# Patient Record
Sex: Male | Born: 1990 | Race: Black or African American | Hispanic: No | Marital: Single | Smoking: Former smoker
Health system: Southern US, Community
[De-identification: ages and names within clinical notes are randomized; demographics above are authoritative.]

## PROBLEM LIST (undated history)

## (undated) ENCOUNTER — Ambulatory Visit

## (undated) ENCOUNTER — Ambulatory Visit: Admission: EM

## (undated) ENCOUNTER — Ambulatory Visit: Admission: EM | Source: Home / Self Care

## (undated) DIAGNOSIS — R569 Unspecified convulsions: Secondary | ICD-10-CM

## (undated) DIAGNOSIS — G40909 Epilepsy, unspecified, not intractable, without status epilepticus: Secondary | ICD-10-CM

---

## 2009-08-04 ENCOUNTER — Emergency Department (HOSPITAL_COMMUNITY): Admission: EM | Admit: 2009-08-04 | Discharge: 2009-08-04 | Payer: Self-pay | Admitting: Emergency Medicine

## 2009-08-20 ENCOUNTER — Emergency Department (HOSPITAL_COMMUNITY): Admission: EM | Admit: 2009-08-20 | Discharge: 2009-08-20 | Payer: Self-pay | Admitting: Emergency Medicine

## 2009-09-21 ENCOUNTER — Emergency Department (HOSPITAL_COMMUNITY): Admission: EM | Admit: 2009-09-21 | Discharge: 2009-09-22 | Payer: Self-pay | Admitting: Emergency Medicine

## 2009-09-28 ENCOUNTER — Emergency Department (HOSPITAL_COMMUNITY): Admission: EM | Admit: 2009-09-28 | Discharge: 2009-09-29 | Payer: Self-pay | Admitting: Emergency Medicine

## 2009-12-13 ENCOUNTER — Emergency Department (HOSPITAL_COMMUNITY)
Admission: EM | Admit: 2009-12-13 | Discharge: 2009-12-13 | Payer: Self-pay | Source: Home / Self Care | Admitting: Emergency Medicine

## 2010-01-09 ENCOUNTER — Emergency Department (HOSPITAL_COMMUNITY)
Admission: EM | Admit: 2010-01-09 | Discharge: 2010-01-09 | Payer: Self-pay | Source: Home / Self Care | Admitting: Emergency Medicine

## 2010-03-29 LAB — DIFFERENTIAL
Basophils Absolute: 0 10*3/uL (ref 0.0–0.1)
Lymphocytes Relative: 17 % (ref 12–46)
Lymphs Abs: 1.7 10*3/uL (ref 0.7–4.0)
Neutro Abs: 7.9 10*3/uL — ABNORMAL HIGH (ref 1.7–7.7)

## 2010-03-29 LAB — COMPREHENSIVE METABOLIC PANEL
ALT: 16 U/L (ref 0–53)
Alkaline Phosphatase: 61 U/L (ref 39–117)
CO2: 29 mEq/L (ref 19–32)
Calcium: 9.3 mg/dL (ref 8.4–10.5)
Chloride: 105 mEq/L (ref 96–112)
GFR calc Af Amer: >60 mL/min (ref >60)
GFR calc non Af Amer: >60 mL/min (ref >60)
Glucose, Bld: 70 mg/dL (ref 70–99)
Potassium: 4.4 mEq/L (ref 3.5–5.1)
Total Bilirubin: 0.4 mg/dL (ref 0.3–1.2)
Total Protein: 6.8 g/dL (ref 6.0–8.3)

## 2010-03-29 LAB — CBC
MCV: 87.1 fL (ref 78.0–100.0)
Platelets: 176 10*3/uL (ref 150–400)
RDW: 12.4 % (ref 11.5–15.5)

## 2010-03-29 LAB — ETHANOL: Alcohol, Ethyl (B): <5 mg/dL (ref 0–10)

## 2010-03-29 LAB — RAPID URINE DRUG SCREEN, HOSP PERFORMED
Amphetamines: NOT DETECTED
Barbiturates: NOT DETECTED
Opiates: NOT DETECTED
Tetrahydrocannabinol: POSITIVE — AB

## 2010-03-31 ENCOUNTER — Emergency Department (HOSPITAL_COMMUNITY): Payer: Self-pay

## 2010-03-31 ENCOUNTER — Emergency Department (HOSPITAL_COMMUNITY)
Admission: EM | Admit: 2010-03-31 | Discharge: 2010-03-31 | Disposition: A | Discharge status: Home / Self Care | Payer: Self-pay | Attending: Emergency Medicine | Admitting: Emergency Medicine

## 2010-03-31 DIAGNOSIS — R569 Unspecified convulsions: Secondary | ICD-10-CM | POA: Insufficient documentation

## 2010-03-31 DIAGNOSIS — N289 Disorder of kidney and ureter, unspecified: Secondary | ICD-10-CM | POA: Insufficient documentation

## 2010-03-31 DIAGNOSIS — W1809XA Striking against other object with subsequent fall, initial encounter: Secondary | ICD-10-CM | POA: Insufficient documentation

## 2010-03-31 DIAGNOSIS — S0003XA Contusion of scalp, initial encounter: Secondary | ICD-10-CM | POA: Insufficient documentation

## 2010-03-31 LAB — POCT I-STAT, CHEM 8
BUN: 10 mg/dL (ref 6–23)
Creatinine, Ser: 1.8 mg/dL — ABNORMAL HIGH (ref 0.4–1.5)
Glucose, Bld: 103 mg/dL — ABNORMAL HIGH (ref 70–99)
Sodium: 139 mEq/L (ref 135–145)

## 2010-03-31 LAB — DIFFERENTIAL
Basophils Relative: 1 % (ref 0–1)
Eosinophils Absolute: 0.1 10*3/uL (ref 0.0–0.7)
Eosinophils Relative: 1 % (ref 0–5)
Lymphs Abs: 2.7 10*3/uL (ref 0.7–4.0)
Monocytes Absolute: 0.4 10*3/uL (ref 0.1–1.0)
Neutrophils Relative %: 59 % (ref 43–77)

## 2010-03-31 LAB — CBC
HCT: 43.4 % (ref 39.0–52.0)
Hemoglobin: 15.3 g/dL (ref 13.0–17.0)
MCH: 30.5 pg (ref 26.0–34.0)
RBC: 5.01 MIL/uL (ref 4.22–5.81)
WBC: 7.8 10*3/uL (ref 4.0–10.5)

## 2010-04-01 LAB — URINALYSIS, ROUTINE W REFLEX MICROSCOPIC
Bilirubin Urine: NEGATIVE
Glucose, UA: NEGATIVE mg/dL
Ketones, ur: NEGATIVE mg/dL
Nitrite: NEGATIVE
Protein, ur: NEGATIVE mg/dL
Specific Gravity, Urine: 1.024 (ref 1.005–1.030)
Urobilinogen, UA: 0.2 mg/dL (ref 0.0–1.0)
pH: 5.5 (ref 5.0–8.0)

## 2010-04-01 LAB — URINE MICROSCOPIC-ADD ON

## 2010-04-01 LAB — GC/CHLAMYDIA PROBE AMP, GENITAL: GC Probe Amp, Genital: POSITIVE — AB

## 2010-04-03 LAB — GC/CHLAMYDIA PROBE AMP, GENITAL
Chlamydia, DNA Probe: POSITIVE — AB
GC Probe Amp, Genital: POSITIVE — AB

## 2010-06-17 ENCOUNTER — Emergency Department (HOSPITAL_COMMUNITY)
Admission: EM | Admit: 2010-06-17 | Discharge: 2010-06-17 | Disposition: A | Discharge status: Home / Self Care | Payer: Self-pay | Attending: Emergency Medicine | Admitting: Emergency Medicine

## 2010-06-17 DIAGNOSIS — Z79899 Other long term (current) drug therapy: Secondary | ICD-10-CM | POA: Insufficient documentation

## 2010-06-17 DIAGNOSIS — G40909 Epilepsy, unspecified, not intractable, without status epilepticus: Secondary | ICD-10-CM | POA: Insufficient documentation

## 2010-06-17 LAB — BASIC METABOLIC PANEL
BUN: 11 mg/dL (ref 6–23)
CO2: 22 mEq/L (ref 19–32)
Calcium: 8.6 mg/dL (ref 8.4–10.5)
Chloride: 104 mEq/L (ref 96–112)
GFR calc Af Amer: >60 mL/min (ref >60)
GFR calc non Af Amer: 58 mL/min — ABNORMAL LOW (ref >60)
Glucose, Bld: 124 mg/dL — ABNORMAL HIGH (ref 70–99)

## 2010-06-17 LAB — CBC
HCT: 40 % (ref 39.0–52.0)
Hemoglobin: 13.6 g/dL (ref 13.0–17.0)
MCH: 29.5 pg (ref 26.0–34.0)
MCHC: 34 g/dL (ref 30.0–36.0)
MCV: 86.8 fL (ref 78.0–100.0)
RBC: 4.61 MIL/uL (ref 4.22–5.81)

## 2010-06-17 LAB — DIFFERENTIAL
Basophils Absolute: 0 10*3/uL (ref 0.0–0.1)
Eosinophils Relative: 1 % (ref 0–5)
Monocytes Relative: 6 % (ref 3–12)
Neutro Abs: 3 10*3/uL (ref 1.7–7.7)
Neutrophils Relative %: 65 % (ref 43–77)

## 2010-09-26 ENCOUNTER — Emergency Department (HOSPITAL_COMMUNITY)
Admission: EM | Admit: 2010-09-26 | Discharge: 2010-09-26 | Disposition: A | Discharge status: Home / Self Care | Payer: Self-pay | Attending: Emergency Medicine | Admitting: Emergency Medicine

## 2010-09-26 DIAGNOSIS — M549 Dorsalgia, unspecified: Secondary | ICD-10-CM | POA: Insufficient documentation

## 2010-09-26 DIAGNOSIS — G40909 Epilepsy, unspecified, not intractable, without status epilepticus: Secondary | ICD-10-CM | POA: Insufficient documentation

## 2010-09-26 LAB — COMPREHENSIVE METABOLIC PANEL
ALT: 19 U/L (ref 0–53)
AST: 29 U/L (ref 0–37)
Albumin: 3.7 g/dL (ref 3.5–5.2)
Alkaline Phosphatase: 77 U/L (ref 39–117)
BUN: 11 mg/dL (ref 6–23)
CO2: 27 mEq/L (ref 19–32)
Calcium: 10.1 mg/dL (ref 8.4–10.5)
Chloride: 101 mEq/L (ref 96–112)
Creatinine, Ser: 1.33 mg/dL (ref 0.50–1.35)
GFR calc Af Amer: >60 mL/min (ref >60)
GFR calc non Af Amer: >60 mL/min (ref >60)
Glucose, Bld: 84 mg/dL (ref 70–99)
Potassium: 3.8 mEq/L (ref 3.5–5.1)
Sodium: 137 mEq/L (ref 135–145)
Total Bilirubin: 0.5 mg/dL (ref 0.3–1.2)
Total Protein: 6.9 g/dL (ref 6.0–8.3)

## 2010-09-26 LAB — URINALYSIS, ROUTINE W REFLEX MICROSCOPIC
Bilirubin Urine: NEGATIVE
Glucose, UA: NEGATIVE mg/dL
Ketones, ur: NEGATIVE mg/dL
Nitrite: NEGATIVE
Protein, ur: NEGATIVE mg/dL
Specific Gravity, Urine: 1.017 (ref 1.005–1.030)
Urobilinogen, UA: 0.2 mg/dL (ref 0.0–1.0)
pH: 6.5 (ref 5.0–8.0)

## 2010-09-26 LAB — URINE MICROSCOPIC-ADD ON

## 2010-12-10 ENCOUNTER — Emergency Department (HOSPITAL_COMMUNITY)
Admission: EM | Admit: 2010-12-10 | Discharge: 2010-12-10 | Disposition: A | Discharge status: Home / Self Care | Payer: Self-pay | Attending: Emergency Medicine | Admitting: Emergency Medicine

## 2010-12-10 ENCOUNTER — Encounter: Payer: Self-pay | Admitting: *Deleted

## 2010-12-10 DIAGNOSIS — Z760 Encounter for issue of repeat prescription: Secondary | ICD-10-CM | POA: Insufficient documentation

## 2010-12-10 DIAGNOSIS — Z79899 Other long term (current) drug therapy: Secondary | ICD-10-CM | POA: Insufficient documentation

## 2010-12-10 DIAGNOSIS — F17200 Nicotine dependence, unspecified, uncomplicated: Secondary | ICD-10-CM | POA: Insufficient documentation

## 2010-12-10 HISTORY — DX: Unspecified convulsions: R56.9

## 2010-12-10 MED ORDER — LEVETIRACETAM 500 MG PO TABS: 500.0000 mg | ORAL_TABLET | Freq: Two times a day (BID) | ORAL | Status: DC

## 2010-12-10 NOTE — ED Notes (Signed)
The pt wants his seizure meds refilled.  He took his last dose yesterday and his doctor is not in the  Office until monday

## 2010-12-10 NOTE — ED Provider Notes (Signed)
Medical screening examination/treatment/procedure(s) were performed by non-physician practitioner and as supervising physician I was immediately available for consultation/collaboration.  Juliet Rude. Rubin Payor, MD 12/10/10 2350

## 2010-12-10 NOTE — ED Notes (Signed)
The pt has no change in his condition needs  rx refilled

## 2010-12-10 NOTE — ED Provider Notes (Signed)
History     CSN: [id number] Arrival date & time: 12/10/2010  7:45 PM   First MD Initiated Contact with Patient 12/10/10 2102      Chief Complaint  Patient presents with  . Medication Refill    (Consider location/radiation/quality/duration/timing/severity/associated sxs/prior treatment) The history is provided by the patient. No language interpreter was used.  Patient states he ran out of his keppra after his dose yesterday.  Patient without any other complaint.  Requesting medication refill.  Past Medical History  Diagnosis Date  . Seizures     History reviewed. No pertinent past surgical history.  History reviewed. No pertinent family history.  History  Substance Use Topics  . Smoking status: Current Everyday Smoker  . Smokeless tobacco: Not on file  . Alcohol Use: No      Review of Systems  All other systems reviewed and are negative.    Allergies  Review of patient's allergies indicates no known allergies.  Home Medications   Current Outpatient Rx  Name Route Sig Dispense Refill  . LEVETIRACETAM 500 MG PO TABS Oral Take 500 mg by mouth 2 (two) times daily.        BP 100/63  Pulse 78  Temp(Src) 98.6 F (37 C) (Oral)  Resp 18  SpO2 98%  Physical Exam  Nursing note and vitals reviewed. Constitutional: He appears well-developed and well-nourished.  Cardiovascular: Normal rate and normal heart sounds.   Pulmonary/Chest: Effort normal and breath sounds normal.  Abdominal: Soft. Bowel sounds are normal.  Psychiatric: He has a normal mood and affect.    ED Course  Procedures (including critical care time)  Labs Reviewed - No data to display No results found.   No diagnosis found.    MDM  Medication refill        Jimmye Norman, NP 12/10/10 2123

## 2011-01-04 ENCOUNTER — Emergency Department (HOSPITAL_COMMUNITY)
Admission: EM | Admit: 2011-01-04 | Discharge: 2011-01-04 | Disposition: A | Discharge status: Home / Self Care | Payer: Self-pay | Attending: Emergency Medicine | Admitting: Emergency Medicine

## 2011-01-04 ENCOUNTER — Other Ambulatory Visit: Payer: Self-pay

## 2011-01-04 DIAGNOSIS — R569 Unspecified convulsions: Secondary | ICD-10-CM

## 2011-01-04 DIAGNOSIS — G40909 Epilepsy, unspecified, not intractable, without status epilepticus: Secondary | ICD-10-CM | POA: Insufficient documentation

## 2011-01-04 DIAGNOSIS — Z79899 Other long term (current) drug therapy: Secondary | ICD-10-CM | POA: Insufficient documentation

## 2011-01-04 LAB — URINALYSIS, ROUTINE W REFLEX MICROSCOPIC
Bilirubin Urine: NEGATIVE
Ketones, ur: NEGATIVE mg/dL
Nitrite: NEGATIVE
Urobilinogen, UA: 0.2 mg/dL (ref 0.0–1.0)
pH: 5 (ref 5.0–8.0)

## 2011-01-04 LAB — POCT I-STAT, CHEM 8
Calcium, Ion: 1.27 mmol/L (ref 1.12–1.32)
Chloride: 104 mEq/L (ref 96–112)
Creatinine, Ser: 1.2 mg/dL (ref 0.50–1.35)
Glucose, Bld: 87 mg/dL (ref 70–99)
HCT: 49 % (ref 39.0–52.0)

## 2011-01-04 MED ORDER — ONDANSETRON HCL 4 MG/2ML IJ SOLN
INTRAMUSCULAR | Status: AC
  Administered 2011-01-04: 15:00:00
  Filled 2011-01-04: qty 2

## 2011-01-04 MED ORDER — ONDANSETRON HCL 4 MG/2ML IJ SOLN
INTRAMUSCULAR | Status: AC
  Administered 2011-01-04: 16:00:00
  Filled 2011-01-04: qty 2

## 2011-01-04 MED ORDER — IBUPROFEN 800 MG PO TABS
800.0000 mg | ORAL_TABLET | Freq: Once | ORAL | Status: AC
  Administered 2011-01-04: 800 mg via ORAL
  Filled 2011-01-04: qty 1

## 2011-01-04 NOTE — ED Notes (Signed)
Patient is resting comfortably. 

## 2011-01-04 NOTE — ED Notes (Signed)
Patient denies pain and is resting comfortably.  

## 2011-01-04 NOTE — ED Notes (Signed)
Family at bedside. 

## 2011-01-04 NOTE — ED Provider Notes (Signed)
History     CSN: [id number] Arrival date & time: 01/04/2011  3:23 PM   First MD Initiated Contact with Patient 01/04/11 1603      Chief Complaint  Patient presents with  . Seizures    (Consider location/radiation/quality/duration/timing/severity/associated sxs/prior treatment) HPI A LEVEL 5 CAVEAT PERTAINS DUE TO ALTERED MENTAL STATUS Patient with history of a seizure disorder presents after a reported generalized tonic-clonic seizure. He was brought in by EMS. Patient is arousable but unable to answer questions or contribute to the history to 2 somnolence and post ictal state. He does state that he has been taking his Keppra regularly and denies having any acute illnesses recently.  Past Medical History  Diagnosis Date  . Seizures     No past surgical history on file.  No family history on file.  History  Substance Use Topics  . Smoking status: Current Everyday Smoker  . Smokeless tobacco: Not on file  . Alcohol Use: No      Review of Systems ROS reviewed and otherwise negative except for mentioned in HPI  Allergies  Review of patient's allergies indicates no known allergies.  Home Medications   Current Outpatient Rx  Name Route Sig Dispense Refill  . LEVETIRACETAM 500 MG PO TABS Oral Take 1 tablet (500 mg total) by mouth every 12 (twelve) hours. 30 tablet 0    BP 114/71  Pulse 84  Temp(Src) 98.4 F (36.9 C) (Oral)  Resp 20  SpO2 98% Vitals reviewed Physical Exam Physical Examination: General appearance - tired appearing, sleepy, arousable and answering questions Mental status - alert, oriented to person, place, and time Eyes - pupils equal and reactive, extraocular eye movements intact Mouth - mucous membranes moist, pharynx normal without lesions Chest - clear to auscultation, no wheezes, rales or rhonchi, symmetric air entry Heart - normal rate, regular rhythm, normal S1, S2, no murmurs, rubs, clicks or gallops Abdomen - soft, nontender,  nondistended, no masses or organomegaly Neurological - somnolent, but answering questions, strength 5/5 in extremities x 4, sensation intact, cranial nerves 2-12 tested and intact Musculoskeletal - no joint tenderness, deformity or swelling Extremities - peripheral pulses normal, no pedal edema, no clubbing or cyanosis Skin - normal coloration and turgor, no rashes, no suspicious skin lesions noted  ED Course  Procedures (including critical care time)  Labs Reviewed  URINALYSIS, ROUTINE W REFLEX MICROSCOPIC - Abnormal; Notable for the following:    Hgb urine dipstick SMALL (*)    Protein, ur 30 (*)    All other components within normal limits  URINE MICROSCOPIC-ADD ON - Abnormal; Notable for the following:    Bacteria, UA FEW (*)    Casts HYALINE CASTS (*)    Crystals URIC ACID CRYSTALS (*)    All other components within normal limits  POCT I-STAT, CHEM 8  I-STAT, CHEM 8   No results found.   1. Seizure   2. Seizure disorder       MDM  Patient presenting after generalized tonic-clonic seizure. He has a history of seizure disorder and takes Keppra. He has not had any inciting illnesses. Laboratory and urine evaluation is reassuring. Patient has tolerated fluids in the ED and has returned to his baseline mental status. He was discharged with strict return precautions and is agreeable with this plan.        Ethelda Chick, MD 01/04/11 [phone]

## 2011-01-04 NOTE — ED Notes (Signed)
Vital signs stable. 

## 2011-01-04 NOTE — ED Notes (Signed)
Pt observed to have had a gran mal sz,,,,presently post icthal...recieved Zofran 4mg , IV via 18g PIV in R AC.

## 2011-01-04 NOTE — ED Notes (Signed)
[id number]<YQ> Expected date:01/04/11<BR> Expected time: 3:17 PM<BR> Means of arrival:Ambulance<BR> Comments:<BR> EMS 31 GC - seizure pt/postictal

## 2011-04-12 ENCOUNTER — Other Ambulatory Visit: Payer: Self-pay

## 2011-04-12 ENCOUNTER — Emergency Department (HOSPITAL_COMMUNITY)
Admission: EM | Admit: 2011-04-12 | Discharge: 2011-04-12 | Disposition: A | Discharge status: Home / Self Care | Payer: Self-pay | Attending: Emergency Medicine | Admitting: Emergency Medicine

## 2011-04-12 ENCOUNTER — Emergency Department (HOSPITAL_COMMUNITY): Payer: Self-pay

## 2011-04-12 ENCOUNTER — Encounter (HOSPITAL_COMMUNITY): Payer: Self-pay

## 2011-04-12 DIAGNOSIS — Z79899 Other long term (current) drug therapy: Secondary | ICD-10-CM | POA: Insufficient documentation

## 2011-04-12 DIAGNOSIS — G40909 Epilepsy, unspecified, not intractable, without status epilepticus: Secondary | ICD-10-CM | POA: Insufficient documentation

## 2011-04-12 DIAGNOSIS — W503XXA Accidental bite by another person, initial encounter: Secondary | ICD-10-CM | POA: Insufficient documentation

## 2011-04-12 DIAGNOSIS — R569 Unspecified convulsions: Secondary | ICD-10-CM

## 2011-04-12 DIAGNOSIS — R404 Transient alteration of awareness: Secondary | ICD-10-CM | POA: Insufficient documentation

## 2011-04-12 DIAGNOSIS — S01502A Unspecified open wound of oral cavity, initial encounter: Secondary | ICD-10-CM | POA: Insufficient documentation

## 2011-04-12 DIAGNOSIS — S01512A Laceration without foreign body of oral cavity, initial encounter: Secondary | ICD-10-CM

## 2011-04-12 LAB — DIFFERENTIAL
Eosinophils Relative: 1 % (ref 0–5)
Lymphocytes Relative: 22 % (ref 12–46)
Lymphs Abs: 1.8 10*3/uL (ref 0.7–4.0)
Monocytes Absolute: 0.6 10*3/uL (ref 0.1–1.0)
Monocytes Relative: 7 % (ref 3–12)

## 2011-04-12 LAB — CBC
HCT: 43.7 % (ref 39.0–52.0)
Hemoglobin: 15.1 g/dL (ref 13.0–17.0)
MCV: 85.5 fL (ref 78.0–100.0)
RBC: 5.11 MIL/uL (ref 4.22–5.81)
RDW: 12.2 % (ref 11.5–15.5)
WBC: 8.1 10*3/uL (ref 4.0–10.5)

## 2011-04-12 LAB — BASIC METABOLIC PANEL
BUN: 14 mg/dL (ref 6–23)
CO2: 28 mEq/L (ref 19–32)
Calcium: 9.3 mg/dL (ref 8.4–10.5)
Creatinine, Ser: 1.29 mg/dL (ref 0.50–1.35)
Glucose, Bld: 98 mg/dL (ref 70–99)

## 2011-04-12 MED ORDER — SODIUM CHLORIDE 0.9 % IV BOLUS (SEPSIS)
1000.0000 mL | Freq: Once | INTRAVENOUS | Status: AC
  Administered 2011-04-12: 1000 mL via INTRAVENOUS

## 2011-04-12 MED ORDER — SODIUM CHLORIDE 0.9 % IV SOLN
1000.0000 mg | Freq: Two times a day (BID) | INTRAVENOUS | Status: DC
  Administered 2011-04-12: 1000 mg via INTRAVENOUS
  Filled 2011-04-12 (×3): qty 10

## 2011-04-12 MED ORDER — LEVETIRACETAM 500 MG PO TABS: 500.0000 mg | ORAL_TABLET | Freq: Two times a day (BID) | ORAL | Status: DC

## 2011-04-12 NOTE — ED Provider Notes (Signed)
Medical screening examination/treatment/procedure(s) were performed by non-physician practitioner and as supervising physician I was immediately available for consultation/collaboration.   Lyanne Co, MD 04/12/11 [phone]

## 2011-04-12 NOTE — ED Provider Notes (Signed)
History     CSN: [id number]  Arrival date & time 04/12/11  1124   First MD Initiated Contact with Patient 04/12/11 1147      Chief Complaint  Patient presents with  . Seizures    (Consider location/radiation/quality/duration/timing/severity/associated sxs/prior treatment) HPI  Pretibial male with history of seizure is presenting to the ED today with an apparent seizure activities. According to nurse's note the patient's gf witnessed patient having a seizure episode lasts for about 2 minutes. Per EMS, patient was found to be in postictal state with the apparent tongue lac, no evidence of urinary or bowel incontinence. Patient is on Keppra but did not take it this morning. History was limited as patient appears to be drowsy and does not want to communicate at this time.  Pt denies having pain at this time.    Past Medical History  Diagnosis Date  . Seizures     No past surgical history on file.  No family history on file.  History  Substance Use Topics  . Smoking status: Current Everyday Smoker  . Smokeless tobacco: Not on file  . Alcohol Use: No      Review of Systems  Unable to perform ROS: Other  Post-ictal state  Allergies  Review of patient's allergies indicates no known allergies.  Home Medications   Current Outpatient Rx  Name Route Sig Dispense Refill  . LEVETIRACETAM 500 MG PO TABS Oral Take 1 tablet (500 mg total) by mouth every 12 (twelve) hours. 30 tablet 0    BP 112/60  Pulse 77  Temp(Src) 98.1 F (36.7 C) (Oral)  SpO2 97%  Physical Exam  Nursing note and vitals reviewed. Constitutional: He appears well-developed and well-nourished. No distress.  HENT:  Head: Normocephalic and atraumatic.  Right Ear: External ear normal.  Left Ear: External ear normal.  Mouth/Throat: Oropharynx is clear and moist. No oropharyngeal exudate.       mild superficial laceration noted to the lateral aspects of the tongue proximally.  Eyes: Conjunctivae and EOM  are normal. Pupils are equal, round, and reactive to light. No scleral icterus.  Neck: Normal range of motion. Neck supple.  Cardiovascular: Normal rate and regular rhythm.   Pulmonary/Chest: Effort normal. No respiratory distress.  Abdominal: Soft. There is no tenderness.  Musculoskeletal: Normal range of motion.  Neurological:       Drowsy but no obvious focal neuro deficit  Skin: Skin is warm.    ED Course  Procedures (including critical care time)  Labs Reviewed - No data to display No results found.   No diagnosis found.   Date: 04/12/2011  Rate: 72  Rhythm: normal sinus rhythm  QRS Axis: normal  Intervals: normal  ST/T Wave abnormalities: normal  Conduction Disutrbances:none  Narrative Interpretation:   Old EKG Reviewed: unchanged  Results for orders placed during the hospital encounter of 04/12/11  CBC      Component Value Range   WBC 8.1  4.0 - 10.5 (K/uL)   RBC 5.11  4.22 - 5.81 (MIL/uL)   Hemoglobin 15.1  13.0 - 17.0 (g/dL)   HCT 40.9  81.1 - 91.4 (%)   MCV 85.5  78.0 - 100.0 (fL)   MCH 29.5  26.0 - 34.0 (pg)   MCHC 34.6  30.0 - 36.0 (g/dL)   RDW 78.2  95.6 - 21.3 (%)   Platelets 228  150 - 400 (K/uL)  DIFFERENTIAL      Component Value Range   Neutrophils Relative 70  43 -  77 (%)   Neutro Abs 5.7  1.7 - 7.7 (K/uL)   Lymphocytes Relative 22  12 - 46 (%)   Lymphs Abs 1.8  0.7 - 4.0 (K/uL)   Monocytes Relative 7  3 - 12 (%)   Monocytes Absolute 0.6  0.1 - 1.0 (K/uL)   Eosinophils Relative 1  0 - 5 (%)   Eosinophils Absolute 0.1  0.0 - 0.7 (K/uL)   Basophils Relative 0  0 - 1 (%)   Basophils Absolute 0.0  0.0 - 0.1 (K/uL)  BASIC METABOLIC PANEL      Component Value Range   Sodium 137  135 - 145 (mEq/L)   Potassium 4.2  3.5 - 5.1 (mEq/L)   Chloride 103  96 - 112 (mEq/L)   CO2 28  19 - 32 (mEq/L)   Glucose, Bld 98  70 - 99 (mg/dL)   BUN 14  6 - 23 (mg/dL)   Creatinine, Ser 8.11  0.50 - 1.35 (mg/dL)   Calcium 9.3  8.4 - 91.4 (mg/dL)   GFR calc non Af  Amer 78 (*) >90 (mL/min)   GFR calc Af Amer 90 (*) >90 (mL/min)  ETHANOL      Component Value Range   Alcohol, Ethyl (B) <11  0 - 11 (mg/dL)   Ct Head Wo Contrast  04/12/2011  *RADIOLOGY REPORT*  Clinical Data: Seizure  CT HEAD WITHOUT CONTRAST  Technique:  Contiguous axial images were obtained from the base of the skull through the vertex without contrast.  Comparison: 03/31/2010  Findings: There is no evidence of acute intracranial hemorrhage, brain edema, mass lesion, acute infarction,   mass effect, or midline shift. Acute infarct may be inapparent on noncontrast CT. No other intra-axial abnormalities are seen, and the ventricles and sulci are within normal limits in size and symmetry.   No abnormal extra-axial fluid collections or masses are identified.  No significant calvarial abnormality.  IMPRESSION: 1. Negative for bleed or other acute intracranial process.  Original Report Authenticated By: Osa Craver, M.D.      MDM  Seizure episode with tongue lac in pt w/ known hx of seizure.  Having post-ictal sts.  Unable to get full history, pt is not back to baseline.  Will initiate workup.  Keppra 1g IV given as loading dose.  Pt does follows simple command and appears to have no focal neuro deficits.  1:42 PM Patient is back to his normal baseline. He currently denies any headache, numbness or weakness, chest pain, shortness of breath, abdominal pain. Patient states he had been having a seizure episode since last year. He has been evaluated by a neurologist, Dr.Penamali. Sts he has had MRIs and diagnostic test performed with no obvious finding. He has been taking his Keppra as described. However he continues to have recurrent seizure at least once a week. He denies any recent alcohol or recreational drug use. He denies any medication changes. He denies any recent sickness. Last visit to neurologist was last year.    2:14 PM Plan to upped his Keppra to 1000mg  BID and to f/u with  [facility] for further evaluation.  Discussed driving privilege with recommendation of no driving.    3:10 PM I was able to talk to patient's girlfriend whoas well and is currently taking care of him. His gf states patient has been having recurrent seizure episodes up until December. He has not had any until today. Patient did miss his Keppra dose this morning. His girlfriend noticed  that the pill bottle was empty. With this new information, I will prescribe a pace usual dose of Keppra and also instruct the patient to followup with his neurologist in the next several days. Both patient and her friend agrees with plan. Patient is ready to be discharged.  Fayrene Helper, PA-C 04/12/11 1512

## 2011-04-12 NOTE — ED Notes (Signed)
Bed:WA22<BR> Expected date:<BR> Expected time:<BR> Means of arrival:<BR> Comments:<BR> Ems/ seizure

## 2011-04-12 NOTE — ED Notes (Signed)
Per ems, pt from home, gf witnessed pt's seizure activity x 2 min, ems found pt post itcal, tongue injury noted. Pt alert, oriented at the moment. Takes keppra but didn't take it this morning.

## 2011-04-12 NOTE — Discharge Instructions (Signed)
Driving and Equipment Restrictions Some medical problems make it dangerous to drive, ride a bike, or use machines. Some of these problems are:  A hard blow to the head (concussion).   Passing out (fainting).   Twitching and shaking (seizures).   Low blood sugar.   Taking medicine to help you relax (sedatives).   Taking pain medicines.   Wearing an eye patch.   Wearing splints. This can make it hard to use parts of your body that you need to drive safely.  HOME CARE   Do not drive until your doctor says it is okay.   Do not use machines until your doctor says it is okay.  You may need a form signed by your doctor (medical release) before you can drive again. You may also need this form before you do other tasks where you need to be fully alert. MAKE SURE YOU:  Understand these instructions.   Will watch your condition.   Will get help right away if you are not doing well or get worse.  Document Released: 02/11/2004 Document Revised: 12/23/2010 Document Reviewed: 05/13/2009 [employer] Patient Information 2012 [employer].Seizure, Adult A seizure is when the body shakes uncontrollably (convulsion). It can be a scary experience. A seizure is not a diagnosis. It is a sign that something else may be wrong with brain and/or spinal cord (central nervous system). In the Emergency Department, your condition is evaluated. The seizure is then treated. You will likely need follow-up with your caregiver. You will possibly need further testing and evaluation. Your caregiver or the specialist to whom you are referred will determine if further treatment is needed. After a seizure, you may be confused, dazed and drowsy. These problems (symptoms) often follow a seizure. Medication given to treat the seizure may also cause some of these changes. The time following a seizure is known as a refractory period. Hospital admission is seldom required unless there are other conditions present such as trauma  or metabolic problems. Sometimes the seizure activity follows a fainting episode. This may have been caused by a brief drop in blood pressure. These fainting (syncopal) seizures are generally not a cause for concern.  HOME CARE INSTRUCTIONS   Follow up with your caregiver as suggested.   If any problems happen, get help right away.   Do not swim or drive until your caregiver says it is okay.  Document Released: 01/01/2000 Document Revised: 12/23/2010 Document Reviewed: 12/22/2010 [employer] Patient Information 2012 [employer]. 

## 2011-07-04 ENCOUNTER — Emergency Department (HOSPITAL_COMMUNITY): Payer: Self-pay

## 2011-07-04 ENCOUNTER — Emergency Department (HOSPITAL_COMMUNITY)
Admission: EM | Admit: 2011-07-04 | Discharge: 2011-07-04 | Disposition: A | Discharge status: Home / Self Care | Payer: Self-pay | Attending: Emergency Medicine | Admitting: Emergency Medicine

## 2011-07-04 ENCOUNTER — Encounter (HOSPITAL_COMMUNITY): Payer: Self-pay | Admitting: Emergency Medicine

## 2011-07-04 DIAGNOSIS — S62606A Fracture of unspecified phalanx of right little finger, initial encounter for closed fracture: Secondary | ICD-10-CM

## 2011-07-04 DIAGNOSIS — IMO0002 Reserved for concepts with insufficient information to code with codable children: Secondary | ICD-10-CM | POA: Insufficient documentation

## 2011-07-04 DIAGNOSIS — M25529 Pain in unspecified elbow: Secondary | ICD-10-CM | POA: Insufficient documentation

## 2011-07-04 DIAGNOSIS — S0101XA Laceration without foreign body of scalp, initial encounter: Secondary | ICD-10-CM

## 2011-07-04 DIAGNOSIS — R220 Localized swelling, mass and lump, head: Secondary | ICD-10-CM | POA: Insufficient documentation

## 2011-07-04 DIAGNOSIS — S62309A Unspecified fracture of unspecified metacarpal bone, initial encounter for closed fracture: Secondary | ICD-10-CM | POA: Insufficient documentation

## 2011-07-04 DIAGNOSIS — S0100XA Unspecified open wound of scalp, initial encounter: Secondary | ICD-10-CM | POA: Insufficient documentation

## 2011-07-04 DIAGNOSIS — M25519 Pain in unspecified shoulder: Secondary | ICD-10-CM | POA: Insufficient documentation

## 2011-07-04 DIAGNOSIS — R221 Localized swelling, mass and lump, neck: Secondary | ICD-10-CM | POA: Insufficient documentation

## 2011-07-04 DIAGNOSIS — S62308A Unspecified fracture of other metacarpal bone, initial encounter for closed fracture: Secondary | ICD-10-CM

## 2011-07-04 DIAGNOSIS — S62609A Fracture of unspecified phalanx of unspecified finger, initial encounter for closed fracture: Secondary | ICD-10-CM | POA: Insufficient documentation

## 2011-07-04 DIAGNOSIS — T07XXXA Unspecified multiple injuries, initial encounter: Secondary | ICD-10-CM | POA: Insufficient documentation

## 2011-07-04 MED ORDER — IBUPROFEN 800 MG PO TABS
ORAL_TABLET | ORAL | Status: AC
  Filled 2011-07-04: qty 1

## 2011-07-04 MED ORDER — TETANUS-DIPHTH-ACELL PERTUSSIS 5-2.5-18.5 LF-MCG/0.5 IM SUSP
0.5000 mL | Freq: Once | INTRAMUSCULAR | Status: AC
  Administered 2011-07-04: 0.5 mL via INTRAMUSCULAR
  Filled 2011-07-04: qty 0.5

## 2011-07-04 MED ORDER — HYDROCODONE-ACETAMINOPHEN 5-325 MG PO TABS: 1.0000 | ORAL_TABLET | ORAL | Status: AC | PRN

## 2011-07-04 MED ORDER — IBUPROFEN 800 MG PO TABS: 800.0000 mg | ORAL_TABLET | Freq: Three times a day (TID) | ORAL | Status: AC | PRN

## 2011-07-04 MED ORDER — IBUPROFEN 800 MG PO TABS
800.0000 mg | ORAL_TABLET | Freq: Once | ORAL | Status: AC
  Administered 2011-07-04: 800 mg via ORAL

## 2011-07-04 NOTE — ED Notes (Signed)
Patient transported to X-ray 
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
## 2011-07-04 NOTE — Progress Notes (Signed)
Orthopedic Tech Progress Note Patient Details:  Brandon Guerrero [birth year] [id number]  Ortho Devices Type of Ortho Device: Ulna gutter splint Ortho Device/Splint Location: right arm Ortho Device/Splint Interventions: Application   Brandon Guerrero T 07/04/2011, 8:33 AM

## 2011-07-04 NOTE — ED Notes (Signed)
Pt just returned from Ct and X-ray.
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
## 2011-07-05 NOTE — ED Provider Notes (Signed)
Medical screening examination/treatment/procedure(s) were performed by non-physician practitioner and as supervising physician I was immediately available for consultation/collaboration.   Nat Christen, MD 07/05/11 [phone]

## 2011-11-04 ENCOUNTER — Encounter (HOSPITAL_COMMUNITY): Payer: Self-pay | Admitting: *Deleted

## 2011-11-04 ENCOUNTER — Emergency Department (HOSPITAL_COMMUNITY)
Admission: EM | Admit: 2011-11-04 | Discharge: 2011-11-04 | Disposition: A | Discharge status: Home / Self Care | Payer: Self-pay | Attending: Emergency Medicine | Admitting: Emergency Medicine

## 2011-11-04 DIAGNOSIS — F17200 Nicotine dependence, unspecified, uncomplicated: Secondary | ICD-10-CM | POA: Insufficient documentation

## 2011-11-04 DIAGNOSIS — R569 Unspecified convulsions: Secondary | ICD-10-CM | POA: Insufficient documentation

## 2011-11-04 LAB — POCT I-STAT, CHEM 8
BUN: 14 mg/dL (ref 6–23)
Creatinine, Ser: 1.4 mg/dL — ABNORMAL HIGH (ref 0.50–1.35)
Potassium: 3.9 mEq/L (ref 3.5–5.1)
Sodium: 143 mEq/L (ref 135–145)
TCO2: 20 mmol/L (ref 0–100)

## 2011-11-04 MED ORDER — LEVETIRACETAM 500 MG PO TABS: 500.0000 mg | ORAL_TABLET | Freq: Two times a day (BID) | ORAL | Status: DC

## 2011-11-04 MED ORDER — ACETAMINOPHEN 325 MG PO TABS
650.0000 mg | ORAL_TABLET | Freq: Once | ORAL | Status: AC
  Administered 2011-11-04: 650 mg via ORAL
  Filled 2011-11-04: qty 2

## 2011-11-04 NOTE — ED Notes (Signed)
Seizure pads placed on side bedrails

## 2011-11-04 NOTE — ED Provider Notes (Signed)
Medical screening examination/treatment/procedure(s) were performed by non-physician practitioner and as supervising physician I was immediately available for consultation/collaboration.   Glynn Octave, MD 11/04/11 [phone]

## 2011-11-04 NOTE — ED Notes (Signed)
Pt tolerated water well, drank 2 cups without difficulty. Pt woke up and drank. Fell back to sleep after. Asking about girlfriend who is is not here at the time.

## 2011-11-04 NOTE — ED Notes (Signed)
Pt undressed, in gown, on monitor, continuous pulse oximetry and blood pressure cuff; warm blanket given 

## 2011-11-04 NOTE — ED Notes (Signed)
Pt. Has c/o having a sz. Today.  Per family pt. had more than one sz. That lasted a little under a minute.

## 2011-11-04 NOTE — ED Notes (Signed)
Pt sleeping, appearing post-ictical. Pt groggy, opens eyes to commands. Denying any pain. Seizure pads in place. Pt on monitor, will continue to monitor.

## 2011-11-04 NOTE — ED Provider Notes (Signed)
History     CSN: [id number]  Arrival date & time 11/04/11  0830   First MD Initiated Contact with Patient 11/04/11 [phone]      Chief Complaint  Patient presents with  . Seizures    (Consider location/radiation/quality/duration/timing/severity/associated sxs/prior treatment) HPI  Patient presents to the emergency department after having a seizure at home today per girlfriend it lasted under a minutes. The patient has known seizure disorder for which she takes Keppra which was prescribed by Felicie Morn nurse practitioner. He states that he has been taking his Keppra as prescribed and that he needs a refill. The patient is sleepy but awake alert and able to communicate. He denies having any injuries or hurting anywhere. NAD/VSS  Past Medical History  Diagnosis Date  . Seizures     History reviewed. No pertinent past surgical history.  History reviewed. No pertinent family history.  History  Substance Use Topics  . Smoking status: Current Every Day Smoker  . Smokeless tobacco: Not on file  . Alcohol Use: Yes      Review of Systems   Review of Systems  Gen: no weight loss, fevers, chills, night sweats, + seizure Eyes: no discharge or drainage, no occular pain or visual changes  Nose: no epistaxis or rhinorrhea  Mouth: no dental pain, no sore throat  Neck: no neck pain  Lungs:No wheezing, coughing or hemoptysis CV: no chest pain, palpitations, dependent edema or orthopnea  Abd: no abdominal pain, nausea, vomiting  GU: no dysuria or gross hematuria  MSK:  No abnormalities  Neuro: no headache, no focal neurologic deficits  Skin: no abnormalities Psyche: negative.    Allergies  Tomato  Home Medications   Current Outpatient Rx  Name Route Sig Dispense Refill  . LEVETIRACETAM 500 MG PO TABS Oral Take 1 tablet (500 mg total) by mouth every 12 (twelve) hours. 60 tablet 0    BP 107/71  Pulse 79  Temp 98.6 F (37 C)  Resp 18  SpO2 99%  Physical Exam  Nursing  note and vitals reviewed. Constitutional: He appears well-developed and well-nourished. No distress.       Post ictal  HENT:  Head: Normocephalic and atraumatic.  Eyes: Pupils are equal, round, and reactive to light.  Neck: Normal range of motion. Neck supple.  Cardiovascular: Normal rate and regular rhythm.   Pulmonary/Chest: Effort normal.  Abdominal: Soft.  Neurological: He is alert.  Skin: Skin is warm and dry.    ED Course  Procedures (including critical care time)  Labs Reviewed  POCT I-STAT, CHEM 8 - Abnormal; Notable for the following:    Creatinine, Ser 1.40 (*)     All other components within normal limits   No results found.   1. Seizure       MDM  Pt back to baseline. Ambulated to bathroom unassisted, ate food and drank liquid without any difficulty. Feeling better, will refill Keppra and give referral to Neuro, he may need some medication adjustments due to recurrent seizure while medicated.  Pt has been advised of the symptoms that warrant their return to the ED. Patient has voiced understanding and has agreed to follow-up with the PCP or specialist.         Dorthula Matas, PA 11/04/11 1044

## 2012-02-23 ENCOUNTER — Encounter (HOSPITAL_COMMUNITY): Payer: Self-pay | Admitting: Emergency Medicine

## 2012-02-23 ENCOUNTER — Emergency Department (HOSPITAL_COMMUNITY)
Admission: EM | Admit: 2012-02-23 | Discharge: 2012-02-23 | Disposition: A | Discharge status: Home / Self Care | Payer: Self-pay | Attending: Emergency Medicine | Admitting: Emergency Medicine

## 2012-02-23 DIAGNOSIS — S0003XA Contusion of scalp, initial encounter: Secondary | ICD-10-CM | POA: Insufficient documentation

## 2012-02-23 DIAGNOSIS — F17200 Nicotine dependence, unspecified, uncomplicated: Secondary | ICD-10-CM | POA: Insufficient documentation

## 2012-02-23 DIAGNOSIS — Y939 Activity, unspecified: Secondary | ICD-10-CM | POA: Insufficient documentation

## 2012-02-23 DIAGNOSIS — S01502A Unspecified open wound of oral cavity, initial encounter: Secondary | ICD-10-CM | POA: Insufficient documentation

## 2012-02-23 DIAGNOSIS — Y92009 Unspecified place in unspecified non-institutional (private) residence as the place of occurrence of the external cause: Secondary | ICD-10-CM | POA: Insufficient documentation

## 2012-02-23 DIAGNOSIS — Z79899 Other long term (current) drug therapy: Secondary | ICD-10-CM | POA: Insufficient documentation

## 2012-02-23 DIAGNOSIS — Z9119 Patient's noncompliance with other medical treatment and regimen: Secondary | ICD-10-CM | POA: Insufficient documentation

## 2012-02-23 DIAGNOSIS — S1093XA Contusion of unspecified part of neck, initial encounter: Secondary | ICD-10-CM | POA: Insufficient documentation

## 2012-02-23 DIAGNOSIS — R569 Unspecified convulsions: Secondary | ICD-10-CM

## 2012-02-23 DIAGNOSIS — R51 Headache: Secondary | ICD-10-CM | POA: Insufficient documentation

## 2012-02-23 DIAGNOSIS — W19XXXA Unspecified fall, initial encounter: Secondary | ICD-10-CM | POA: Insufficient documentation

## 2012-02-23 DIAGNOSIS — Y999 Unspecified external cause status: Secondary | ICD-10-CM | POA: Insufficient documentation

## 2012-02-23 DIAGNOSIS — Z91199 Patient's noncompliance with other medical treatment and regimen due to unspecified reason: Secondary | ICD-10-CM | POA: Insufficient documentation

## 2012-02-23 LAB — URINE MICROSCOPIC-ADD ON

## 2012-02-23 LAB — URINALYSIS, ROUTINE W REFLEX MICROSCOPIC
Bilirubin Urine: NEGATIVE
Glucose, UA: NEGATIVE mg/dL
Hgb urine dipstick: NEGATIVE
Nitrite: NEGATIVE

## 2012-02-23 LAB — RAPID URINE DRUG SCREEN, HOSP PERFORMED
Amphetamines: NOT DETECTED
Tetrahydrocannabinol: POSITIVE — AB

## 2012-02-23 LAB — CBC
HCT: 43.5 % (ref 39.0–52.0)
Hemoglobin: 14.7 g/dL (ref 13.0–17.0)
MCH: 30 pg (ref 26.0–34.0)
MCV: 88.8 fL (ref 78.0–100.0)
RBC: 4.9 MIL/uL (ref 4.22–5.81)

## 2012-02-23 LAB — BASIC METABOLIC PANEL
BUN: 15 mg/dL (ref 6–23)
CO2: 24 mEq/L (ref 19–32)
Calcium: 9.1 mg/dL (ref 8.4–10.5)
Creatinine, Ser: 1.49 mg/dL — ABNORMAL HIGH (ref 0.50–1.35)
Glucose, Bld: 85 mg/dL (ref 70–99)

## 2012-02-23 MED ORDER — LEVETIRACETAM 500 MG PO TABS: 500.0000 mg | ORAL_TABLET | Freq: Two times a day (BID) | ORAL | Status: DC

## 2012-02-23 MED ORDER — SODIUM CHLORIDE 0.9 % IV SOLN
1000.0000 mg | Freq: Once | INTRAVENOUS | Status: AC
  Administered 2012-02-23: 1000 mg via INTRAVENOUS
  Filled 2012-02-23: qty 10

## 2012-02-23 NOTE — ED Provider Notes (Signed)
Medical screening examination/treatment/procedure(s) were performed by non-physician practitioner and as supervising physician I was immediately available for consultation/collaboration.  Sunnie Nielsen, MD 02/23/12 [phone]

## 2012-02-23 NOTE — ED Provider Notes (Signed)
History     CSN: [id number]  Arrival date & time 02/23/12  0358   First MD Initiated Contact with Patient 02/23/12 0500      Chief Complaint  Patient presents with  . Seizures  . Head Injury    (Consider location/radiation/quality/duration/timing/severity/associated sxs/prior treatment) Patient is a 22 y.o. male presenting with seizures. The history is provided by the patient and the EMS personnel.  Seizures  This is a recurrent problem. The current episode started less than 1 hour ago. The problem has been resolved. There were 2 to 3 seizures. Duration: unknown by pt and witness not in room  Associated symptoms include sleepiness, confusion and headaches. Pertinent negatives include no speech difficulty, no visual disturbance, no neck stiffness, no sore throat, no chest pain, no cough, no nausea, no diarrhea and no muscle weakness. Characteristics include bit tongue. unknown by pt bc LOC and witness not here The episode was witnessed. There was no sensation of an aura present. The seizures did not continue in the ED. Focality: ?? Possible causes include sleep deprivation. Possible causes do not include med or dosage change, missed seizure meds, recent illness or change in alcohol use. There has been no fever.    Past Medical History  Diagnosis Date  . Seizures     History reviewed. No pertinent past surgical history.  History reviewed. No pertinent family history.  History  Substance Use Topics  . Smoking status: Current Every Day Smoker  . Smokeless tobacco: Not on file  . Alcohol Use: Yes      Review of Systems  Constitutional: Negative for fever, diaphoresis and activity change.  HENT: Negative for congestion, sore throat and neck pain.   Eyes: Negative for visual disturbance.  Respiratory: Negative for cough.   Cardiovascular: Negative for chest pain.  Gastrointestinal: Negative for nausea and diarrhea.  Genitourinary: Negative for dysuria.  Musculoskeletal:  Negative for myalgias.  Skin: Negative for color change and wound.  Neurological: Positive for seizures and headaches. Negative for speech difficulty.  Psychiatric/Behavioral: Positive for confusion.  All other systems reviewed and are negative.    Allergies  Tomato  Home Medications   Current Outpatient Rx  Name  Route  Sig  Dispense  Refill  . LEVETIRACETAM 500 MG PO TABS   Oral   Take 1 tablet (500 mg total) by mouth every 12 (twelve) hours.   60 tablet   0     BP 106/59  Pulse 74  Temp 98.3 F (36.8 C) (Oral)  Resp 16  SpO2 100%  Physical Exam  Nursing note and vitals reviewed. Constitutional: He appears well-developed and well-nourished. No distress.       Post ictal, mildly altered   HENT:  Head: Normocephalic.       Atraumatic head, Mild tongue biting marks on right lateral tongue- no lacerations  Eyes: EOM are normal. Pupils are equal, round, and reactive to light.  Neck: Normal range of motion. Neck supple.       Cervical spinous process non tender without step offs, no difficulty or pain with flexion or extension of neck  Cardiovascular: Normal rate, regular rhythm, normal heart sounds and intact distal pulses.   Pulmonary/Chest: Breath sounds normal. No respiratory distress. He has no wheezes. He has no rales.  Abdominal: Soft. There is no tenderness.  Musculoskeletal: He exhibits no edema and no tenderness.       Full active & passive ROM of arms bilaterally  Neurological:  CN III-VII intact. Iintact coordination, sensation, and motor (finger grip, biceps, hamstrings & dorsiflexion). No pass pointing, good rapid coordination. Gait normal.   Skin: Skin is warm and dry. He is not diaphoretic.       intact    ED Course  Procedures (including critical care time)  Labs Reviewed  BASIC METABOLIC PANEL - Abnormal; Notable for the following:    Creatinine, Ser 1.49 (*)     GFR calc non Af Amer 65 (*)     GFR calc Af Amer 76 (*)     All other  components within normal limits  CBC  GLUCOSE, CAPILLARY   No results found.   No diagnosis found.    MDM  5:33 AM Pt still post ictal from seizure event and unable to obtain good history. Pt states he is compliant on his medications, but does not recall event. He states his last seizure was about a month ago, but does not recall how often he has these episodes. Pt care to be resumed by Dr. Dierdre Highman and [place]. Plan is to check UDS as possible etiology, reevaluate to assure at mental baseline and possibly consult pts neurologist for medication dosage switch.   BP 106/59  Pulse 74  Temp 98.3 F (36.8 C) (Oral)  Resp 16  SpO2 100%           Jaci Carrel, [place] 02/23/12 [phone]

## 2012-02-23 NOTE — ED Notes (Signed)
Brought in by EMS from home after pt's "2 episodes of seizure back to back".  Per EMS:  Pt has had 2 seizures witnessed by pt's girlfriend, pt's girlfriend called EMS; pt had emesis after each seizure; pt arrived to ED room A/Ox4, in no s/s distress.

## 2012-02-23 NOTE — ED Provider Notes (Signed)
  Physical Exam  BP 100/65  Pulse 72  Temp 98.3 F (36.8 C) (Oral)  Resp 18  SpO2 97%  Physical Exam  ED Course  Procedures  MDM Assumed care of the patient from PA PAZ.  S-Patient presents to emergency department for seizure.  He came via EMS. This is a recurrent issue for the patient.  Patient states that he has been taking his Keppra as directed however was unable to refill his medications and has not been taking the medication over the past 3 days.  He is a self-pay patient.  He has followed up previously with neurology but states that he is unable to afford the office visit.  He thinks he will be able to refill his medications today. Patient does state that he needs refills on his Keppra.  Patient complains of pain on the right side of his head.  He has some pain in his tongue.  O- alert and oriented.  No acute distress. Patient with small hematoma to the right temporal area. Multiple bite marks on the distal tongue. Appears neurologically intact.  No tremors.  No nystagmus.  EOMI/PERRLA.  Ambulatory. Patient tolerating by mouth fluids and states that he is ready to leave.  A- seizure likely due to noncompliance with medications.  P- patient given Keppra loading dose of 1000 mg IV.  We'll discharge the patient with his normal dose of Keppra 500 mg twice a day.  On providing the patient with 2 refills on his prescription due to his insurance status and the fact he will likely not followup with neurology.  I have advised the patient that he must pericardial and refilling in taking his medications as prescribed to control his seizures.  At this time there does not appear to be any evidence of an acute emergency medical condition and the patient appears stable for discharge with appropriate outpatient follow up.Diagnosis was discussed with patient who verbalizes understanding and is agreeable to discharge. Pt case discussed with Dr. Dierdre Highman who agrees with my plan.    Arthor Captain,  PA-C 02/23/12 [phone]

## 2012-02-23 NOTE — ED Notes (Signed)
[id number]<AV> Expected date:<BR> Expected time:<BR> Means of arrival:<BR> Comments:<BR> EMS/seizure/post-ictal/EMS found pt in vomit

## 2012-02-23 NOTE — ED Notes (Signed)
Pt was given ginger ale, tolerated well--- denies nausea, states "I'm just a little weak".

## 2012-02-23 NOTE — ED Notes (Signed)
Pt awake and alert at this time-- ambulated well in the hall and in room, tolerated well, denies dizziness.

## 2012-02-23 NOTE — ED Notes (Addendum)
Pt did not wear shirt to hospital. Waiting on his ride to arrive to bring shirt and jacket. Pt sts his ride should be here shortly. Pt ambulatory without assistance to bathroom.

## 2012-02-23 NOTE — ED Notes (Signed)
Pt reports that he takes Keppra for his seizure; pt reports that the last time he had a seizure was a month ago.

## 2012-02-23 NOTE — ED Provider Notes (Signed)
Medical screening examination/treatment/procedure(s) were performed by non-physician practitioner and as supervising physician I was immediately available for consultation/collaboration.  Lionell Matuszak, MD 02/23/12 0655 

## 2012-06-15 ENCOUNTER — Emergency Department (HOSPITAL_COMMUNITY)
Admission: EM | Admit: 2012-06-15 | Discharge: 2012-06-15 | Disposition: A | Discharge status: Home / Self Care | Payer: Self-pay | Attending: Emergency Medicine | Admitting: Emergency Medicine

## 2012-06-15 ENCOUNTER — Emergency Department (HOSPITAL_COMMUNITY): Payer: Self-pay

## 2012-06-15 ENCOUNTER — Encounter (HOSPITAL_COMMUNITY): Payer: Self-pay | Admitting: Emergency Medicine

## 2012-06-15 DIAGNOSIS — W2209XA Striking against other stationary object, initial encounter: Secondary | ICD-10-CM | POA: Insufficient documentation

## 2012-06-15 DIAGNOSIS — R569 Unspecified convulsions: Secondary | ICD-10-CM

## 2012-06-15 DIAGNOSIS — F17200 Nicotine dependence, unspecified, uncomplicated: Secondary | ICD-10-CM | POA: Insufficient documentation

## 2012-06-15 DIAGNOSIS — Z79899 Other long term (current) drug therapy: Secondary | ICD-10-CM | POA: Insufficient documentation

## 2012-06-15 DIAGNOSIS — G40909 Epilepsy, unspecified, not intractable, without status epilepticus: Secondary | ICD-10-CM | POA: Insufficient documentation

## 2012-06-15 DIAGNOSIS — IMO0002 Reserved for concepts with insufficient information to code with codable children: Secondary | ICD-10-CM | POA: Insufficient documentation

## 2012-06-15 DIAGNOSIS — S99929A Unspecified injury of unspecified foot, initial encounter: Secondary | ICD-10-CM | POA: Insufficient documentation

## 2012-06-15 DIAGNOSIS — M25572 Pain in left ankle and joints of left foot: Secondary | ICD-10-CM

## 2012-06-15 DIAGNOSIS — S8990XA Unspecified injury of unspecified lower leg, initial encounter: Secondary | ICD-10-CM | POA: Insufficient documentation

## 2012-06-15 DIAGNOSIS — Y9389 Activity, other specified: Secondary | ICD-10-CM | POA: Insufficient documentation

## 2012-06-15 DIAGNOSIS — Y929 Unspecified place or not applicable: Secondary | ICD-10-CM | POA: Insufficient documentation

## 2012-06-15 LAB — CBC WITH DIFFERENTIAL/PLATELET
Basophils Absolute: 0 10*3/uL (ref 0.0–0.1)
Basophils Relative: 0 % (ref 0–1)
Eosinophils Absolute: 0 10*3/uL (ref 0.0–0.7)
Lymphs Abs: 1.3 10*3/uL (ref 0.7–4.0)
MCH: 30.4 pg (ref 26.0–34.0)
Neutrophils Relative %: 75 % (ref 43–77)
Platelets: 210 10*3/uL (ref 150–400)
RBC: 5.2 MIL/uL (ref 4.22–5.81)
RDW: 12.5 % (ref 11.5–15.5)

## 2012-06-15 LAB — BASIC METABOLIC PANEL
Calcium: 9.8 mg/dL (ref 8.4–10.5)
GFR calc Af Amer: 80 mL/min — ABNORMAL LOW (ref >90)
GFR calc non Af Amer: 69 mL/min — ABNORMAL LOW (ref >90)
Glucose, Bld: 92 mg/dL (ref 70–99)
Potassium: 4.3 mEq/L (ref 3.5–5.1)
Sodium: 137 mEq/L (ref 135–145)

## 2012-06-15 LAB — ETHANOL: Alcohol, Ethyl (B): <11 mg/dL (ref 0–11)

## 2012-06-15 MED ORDER — LEVETIRACETAM 500 MG PO TABS: 500.0000 mg | ORAL_TABLET | Freq: Two times a day (BID) | ORAL | Status: DC

## 2012-06-15 MED ORDER — LEVETIRACETAM 500 MG PO TABS
500.0000 mg | ORAL_TABLET | Freq: Once | ORAL | Status: AC
  Administered 2012-06-15: 500 mg via ORAL
  Filled 2012-06-15: qty 1

## 2012-06-15 NOTE — ED Notes (Signed)
Pt had seizure this am for 2 minutes.  Witnessed by family.  Pt stated to EMS that he had been out of keppra a couple of days.  PMH of seizures.  Pt tired upon EMS arrival.  Pt able to follow commands.  cbg 107.

## 2012-06-15 NOTE — ED Provider Notes (Signed)
History     CSN: [id number]  Arrival date & time 06/15/12  1111   First MD Initiated Contact with Patient 06/15/12 1117      Chief Complaint  Patient presents with  . Seizures    (Consider location/radiation/quality/duration/timing/severity/associated sxs/prior treatment) HPI Comments: Pt presents to the ED for seizure PTA.  Seizure was witnessed by family and lasted approx 2 minutes.  Pt has known seizure d/o and been out of his keppra for approx 3 days.  He usually takes 500mg  BID.  Seizures have improved with this dosing but he continues have intermittent seizures and is concerned that his dose may need to be incrased.  Denies any recent EtOH or illicit drug use.  Pt states his ankle hurts- thinks he hit it on a nearby table during the seizure.  Pain worse with ROM and weight bearing.  No numbness or paresthesias of foot or toes.  No chest pain or SOB.  AAO x4 but feels very tired.  CBG 107 via EMS.  The history is provided by the patient.    Past Medical History  Diagnosis Date  . Seizures     History reviewed. No pertinent past surgical history.  History reviewed. No pertinent family history.  History  Substance Use Topics  . Smoking status: Current Every Day Smoker  . Smokeless tobacco: Not on file  . Alcohol Use: Yes      Review of Systems  Musculoskeletal: Positive for arthralgias.  Neurological: Positive for seizures.  All other systems reviewed and are negative.    Allergies  Tomato  Home Medications   Current Outpatient Rx  Name  Route  Sig  Dispense  Refill  . levETIRAcetam (KEPPRA) 500 MG tablet   Oral   Take 1 tablet (500 mg total) by mouth every 12 (twelve) hours.   60 tablet   0   . levETIRAcetam (KEPPRA) 500 MG tablet   Oral   Take 1 tablet (500 mg total) by mouth 2 (two) times daily.   60 tablet   2     There were no vitals taken for this visit.  Physical Exam  Nursing note and vitals reviewed. Constitutional: He is oriented to  person, place, and time. He appears well-developed and well-nourished. No distress.  HENT:  Head: Normocephalic and atraumatic.  Mouth/Throat: Oropharynx is clear and moist.    Small bite injury to right side of tongue, no active bleeding  Eyes: Conjunctivae and EOM are normal. Pupils are equal, round, and reactive to light.  Neck: Normal range of motion.  Cardiovascular: Normal rate, regular rhythm and normal heart sounds.   Pulmonary/Chest: Effort normal and breath sounds normal. No respiratory distress.  Musculoskeletal:       Left ankle: He exhibits decreased range of motion (due to pain). He exhibits no swelling, no ecchymosis, no deformity and no laceration. Tenderness. Medial malleolus tenderness found. Achilles tendon normal.       Feet:  Limited flexion and extension due to pain, strong distal pulse, sensation intact  Neurological: He is alert and oriented to person, place, and time. He has normal strength. He displays no tremor. No cranial nerve deficit or sensory deficit. He displays no seizure activity.  CN grossly intact, no acute neuro deficits appreciated, no tremor or seizure activity  Skin: Skin is warm and dry.  Psychiatric: He has a normal mood and affect.    ED Course  Procedures (including critical care time)   Date: 06/15/2012  Rate: 81  Rhythm:  normal sinus rhythm  QRS Axis: normal  Intervals: normal  ST/T Wave abnormalities: normal  Conduction Disutrbances:none  Narrative Interpretation: NSR, no STEMI  Old EKG Reviewed: unchanged    Labs Reviewed  BASIC METABOLIC PANEL - Abnormal; Notable for the following:    Creatinine, Ser 1.42 (*)    GFR calc non Af Amer 69 (*)    GFR calc Af Amer 80 (*)    All other components within normal limits  CBC WITH DIFFERENTIAL  ETHANOL  URINE RAPID DRUG SCREEN (HOSP PERFORMED)   Dg Ankle Complete Left  06/15/2012   *RADIOLOGY REPORT*  Clinical Data: Larey Seat and injured left ankle, dorsal pain.  LEFT ANKLE COMPLETE -  3+ VIEW  Comparison: Left tibia-fibula x-rays 07/04/2011.  Findings: No evidence of acute or subacute fracture or dislocation. Ankle mortise intact with well-preserved joint space.  No intrinsic osseous abnormalities.  No evidence of a significant joint effusion.  IMPRESSION: Normal examination.   Original Report Authenticated By: Hulan Saas, M.D.     1. Seizure   2. Ankle pain, left       MDM   22 y.o. M presenting to the ED for seizures.  Known hx of seizures, been without keppra for approx 3 days.  Currently AAOx4 but feels very tired.  Injury to right side of tongue and left ankle.  EKG NSR, no acute ischemic changes or abnormalities.  X-ray negative for acute fx or dislocation.  Labs largely WNL.  Home dose of Keppra given in the ED.  Pt observed for approx 2 hrs without recurrent seizure activity.  Pt ok for d/c.  Rx keppra. FU with PCP to discuss this visit and medication dosing. Discussed plan with pt, he agreed.  Return precautions advised.       Garlon Hatchet, PA-C 06/15/12 1559

## 2012-06-15 NOTE — ED Notes (Signed)
Pt states he hit his L ankle during seizure.

## 2012-06-15 NOTE — ED Notes (Signed)
Patient was given 2x ginger ale. He has also been given a urinal.

## 2012-06-15 NOTE — ED Notes (Signed)
Patient states he already used the bathroom and dumped it out. Informed patient that we need a specimen when he urinates again. RN Fleet Contras notified.

## 2012-06-15 NOTE — ED Notes (Signed)
Pt states he had urine, but dumped it out of urinal prior to collection

## 2012-06-15 NOTE — Progress Notes (Signed)
[facility] has seen patient and provided him with a OC application. ° °

## 2012-06-18 NOTE — ED Provider Notes (Signed)
Medical screening examination/treatment/procedure(s) were performed by non-physician practitioner and as supervising physician I was immediately available for consultation/collaboration.    Brek Reece R Brynn Reznik, MD 06/18/12 1137 

## 2012-11-23 ENCOUNTER — Emergency Department (HOSPITAL_COMMUNITY)
Admission: EM | Admit: 2012-11-23 | Discharge: 2012-11-23 | Disposition: A | Discharge status: Home / Self Care | Payer: Self-pay | Attending: Emergency Medicine | Admitting: Emergency Medicine

## 2012-11-23 ENCOUNTER — Emergency Department (HOSPITAL_COMMUNITY): Payer: Self-pay

## 2012-11-23 ENCOUNTER — Encounter (HOSPITAL_COMMUNITY): Payer: Self-pay | Admitting: Emergency Medicine

## 2012-11-23 DIAGNOSIS — Z91199 Patient's noncompliance with other medical treatment and regimen due to unspecified reason: Secondary | ICD-10-CM | POA: Insufficient documentation

## 2012-11-23 DIAGNOSIS — Z9119 Patient's noncompliance with other medical treatment and regimen: Secondary | ICD-10-CM | POA: Insufficient documentation

## 2012-11-23 DIAGNOSIS — R569 Unspecified convulsions: Secondary | ICD-10-CM

## 2012-11-23 DIAGNOSIS — Z9114 Patient's other noncompliance with medication regimen: Secondary | ICD-10-CM

## 2012-11-23 DIAGNOSIS — G40909 Epilepsy, unspecified, not intractable, without status epilepticus: Secondary | ICD-10-CM | POA: Insufficient documentation

## 2012-11-23 DIAGNOSIS — F17200 Nicotine dependence, unspecified, uncomplicated: Secondary | ICD-10-CM | POA: Insufficient documentation

## 2012-11-23 LAB — POCT I-STAT, CHEM 8
BUN: 16 mg/dL (ref 6–23)
Creatinine, Ser: 1.5 mg/dL — ABNORMAL HIGH (ref 0.50–1.35)
Potassium: 4.2 mEq/L (ref 3.5–5.1)
Sodium: 140 mEq/L (ref 135–145)
TCO2: 28 mmol/L (ref 0–100)

## 2012-11-23 LAB — CBC WITH DIFFERENTIAL/PLATELET
Basophils Absolute: 0 10*3/uL (ref 0.0–0.1)
Lymphocytes Relative: 40 % (ref 12–46)
Lymphs Abs: 3.5 10*3/uL (ref 0.7–4.0)
Neutro Abs: 4.4 10*3/uL (ref 1.7–7.7)
Neutrophils Relative %: 50 % (ref 43–77)
Platelets: 279 10*3/uL (ref 150–400)
RBC: 5.06 MIL/uL (ref 4.22–5.81)
RDW: 12.7 % (ref 11.5–15.5)
WBC: 8.7 10*3/uL (ref 4.0–10.5)

## 2012-11-23 MED ORDER — LEVETIRACETAM 500 MG PO TABS: 500.0000 mg | ORAL_TABLET | Freq: Two times a day (BID) | ORAL | Status: DC

## 2012-11-23 MED ORDER — LEVETIRACETAM 500 MG/5ML IV SOLN
1000.0000 mg | Freq: Once | INTRAVENOUS | Status: AC
  Administered 2012-11-23: 1000 mg via INTRAVENOUS
  Filled 2012-11-23: qty 10

## 2012-11-23 NOTE — ED Notes (Signed)
Per EMS: Pt post itical on arrival. Alert and orient after seizure per EMS. Uncompliant with medication. Last rx given when last seen by dr. Rock Guerrero c/o pain to back area. Pt was dancing shortly after seizure began. Pt ambulatory to bed with moans.

## 2012-11-23 NOTE — ED Provider Notes (Signed)
CSN: [id number]     Arrival date & time 11/23/12  0129 History   First MD Initiated Contact with Patient 11/23/12 [phone]     Chief Complaint  Patient presents with  . Seizures   (Consider location/radiation/quality/duration/timing/severity/associated sxs/prior Treatment) Patient is a 22 y.o. male presenting with seizures. The history is provided by the patient and the EMS personnel.  Seizures Seizure activity on arrival: no   Seizure type:  Grand mal Preceding symptoms: no sensation of an aura present   Initial focality:  None Episode characteristics: generalized shaking   Episode characteristics: no incontinence   Postictal symptoms: no confusion   Return to baseline: yes   Severity:  Mild Timing:  Once Progression:  Resolved Context: medical non-compliance   Context: not alcohol withdrawal   Recent head injury:  No recent head injuries PTA treatment:  None History of seizures: yes   Not taking meds  Past Medical History  Diagnosis Date  . Seizures    History reviewed. No pertinent past surgical history. History reviewed. No pertinent family history. History  Substance Use Topics  . Smoking status: Current Every Day Smoker  . Smokeless tobacco: Not on file  . Alcohol Use: Yes    Review of Systems  Neurological: Positive for seizures.  All other systems reviewed and are negative.    Allergies  Bee venom and Tomato  Home Medications   Current Outpatient Rx  Name  Route  Sig  Dispense  Refill  . levETIRAcetam (KEPPRA) 500 MG tablet   Oral   Take 1 tablet (500 mg total) by mouth every 12 (twelve) hours.   30 tablet   0   . Multiple Vitamin (MULTIVITAMIN WITH MINERALS) TABS   Oral   Take 1 tablet by mouth daily.          BP 115/54  Pulse 99  Temp(Src) 98.8 F (37.1 C) (Oral)  Resp 18  SpO2 95% Physical Exam  Constitutional: He is oriented to person, place, and time. He appears well-developed and well-nourished. No distress.  HENT:  Head:  Normocephalic and atraumatic.  Right Ear: External ear normal.  Left Ear: External ear normal.  Mouth/Throat: Oropharynx is clear and moist.  Eyes: Conjunctivae are normal. Pupils are equal, round, and reactive to light.  Neck: Normal range of motion. Neck supple. No tracheal deviation present.  No c spine tenderness  Cardiovascular: Normal rate, regular rhythm and intact distal pulses.   Pulmonary/Chest: Effort normal and breath sounds normal. No stridor. He has no wheezes. He has no rales.  Abdominal: Soft. Bowel sounds are normal. There is no tenderness. There is no rebound and no guarding.  Musculoskeletal: Normal range of motion.  Neurological: He is alert and oriented to person, place, and time. He has normal reflexes.  Skin: Skin is warm and dry.  Psychiatric: He has a normal mood and affect.    ED Course  Procedures (including critical care time) Labs Review Labs Reviewed  CBC WITH DIFFERENTIAL   Imaging Review No results found.  EKG Interpretation   None       MDM  No diagnosis found. Source of seizures is medication non compliance.  Patient needs to take the anti epileptics as directed.  No driving until cleared by neurology.      Jasmine Awe, MD 11/23/12 [phone]

## 2012-11-23 NOTE — ED Notes (Signed)
Bed: WU98 Expected date:  Expected time:  Means of arrival:  Comments: EMS 22 yo M; Seizure; non-complaint w/ meds

## 2012-12-19 ENCOUNTER — Emergency Department (HOSPITAL_COMMUNITY)
Admission: EM | Admit: 2012-12-19 | Discharge: 2012-12-19 | Disposition: A | Discharge status: Home / Self Care | Payer: Self-pay | Attending: Emergency Medicine | Admitting: Emergency Medicine

## 2012-12-19 ENCOUNTER — Encounter (HOSPITAL_COMMUNITY): Payer: Self-pay | Admitting: Emergency Medicine

## 2012-12-19 DIAGNOSIS — R569 Unspecified convulsions: Secondary | ICD-10-CM

## 2012-12-19 DIAGNOSIS — M549 Dorsalgia, unspecified: Secondary | ICD-10-CM | POA: Insufficient documentation

## 2012-12-19 DIAGNOSIS — Z9114 Patient's other noncompliance with medication regimen: Secondary | ICD-10-CM

## 2012-12-19 DIAGNOSIS — Z91199 Patient's noncompliance with other medical treatment and regimen due to unspecified reason: Secondary | ICD-10-CM | POA: Insufficient documentation

## 2012-12-19 DIAGNOSIS — F17200 Nicotine dependence, unspecified, uncomplicated: Secondary | ICD-10-CM | POA: Insufficient documentation

## 2012-12-19 DIAGNOSIS — G40909 Epilepsy, unspecified, not intractable, without status epilepticus: Secondary | ICD-10-CM | POA: Insufficient documentation

## 2012-12-19 DIAGNOSIS — Z9119 Patient's noncompliance with other medical treatment and regimen: Secondary | ICD-10-CM | POA: Insufficient documentation

## 2012-12-19 LAB — POCT I-STAT, CHEM 8
BUN: 11 mg/dL (ref 6–23)
Calcium, Ion: 1.29 mmol/L — ABNORMAL HIGH (ref 1.12–1.23)
Chloride: 105 meq/L (ref 96–112)
Creatinine, Ser: 1.4 mg/dL — ABNORMAL HIGH (ref 0.50–1.35)
Glucose, Bld: 97 mg/dL (ref 70–99)
HCT: 46 % (ref 39.0–52.0)
Hemoglobin: 15.6 g/dL (ref 13.0–17.0)
Potassium: 4.4 meq/L (ref 3.5–5.1)
Sodium: 140 meq/L (ref 135–145)
TCO2: 24 mmol/L (ref 0–100)

## 2012-12-19 MED ORDER — SODIUM CHLORIDE 0.9 % IV SOLN
1000.0000 mg | Freq: Once | INTRAVENOUS | Status: AC
  Administered 2012-12-19: 1000 mg via INTRAVENOUS
  Filled 2012-12-19: qty 10

## 2012-12-19 MED ORDER — LEVETIRACETAM 500 MG PO TABS: 500.0000 mg | ORAL_TABLET | Freq: Two times a day (BID) | ORAL | Status: DC

## 2012-12-19 NOTE — ED Provider Notes (Signed)
Medical screening examination/treatment/procedure(s) were performed by non-physician practitioner and as supervising physician I was immediately available for consultation/collaboration.  EKG Interpretation   None        Aldona Bryner, MD 12/19/12 1738 

## 2012-12-19 NOTE — ED Notes (Signed)
Pt escorted to discharge window. Verbalized understanding discharge instructions. In no acute distress.  Vitals reviewed.   

## 2012-12-19 NOTE — ED Notes (Signed)
NP at bedside.

## 2012-12-19 NOTE — Progress Notes (Signed)
[facility] provided pt with primary care resources, a [employer], and information about [facility].

## 2012-12-19 NOTE — ED Notes (Signed)
Bed: ZO10 Expected date:  Expected time:  Means of arrival:  Comments: 23 y/o M, out of seizure meds x 2 days, 2 seizures today

## 2012-12-19 NOTE — ED Provider Notes (Signed)
CSN: [id number]     Arrival date & time 12/19/12  1000 History   First MD Initiated Contact with Patient 12/19/12 1032     Chief Complaint  Patient presents with  . Seizures  . Back Pain   (Consider location/radiation/quality/duration/timing/severity/associated sxs/prior Treatment) Patient is a 22 y.o. male presenting with seizures. The history is provided by the patient.  Seizures Seizure activity on arrival: no   Seizure type:  Grand mal Preceding symptoms: no sensation of an aura present   Episode characteristics: generalized shaking   Episode characteristics: no confusion, no disorientation, no incontinence, no tongue biting and responsive   Postictal symptoms: somnolence   Return to baseline: yes   Severity:  Mild Duration: unknown, brief time. Timing:  Once Progression:  Resolved Context: medical non-compliance (Ran out of meds and did not get a refill---has not arranged f/u with his PCP/neurologist in at least 6 months)   Context: not alcohol withdrawal   Context comment:  Previous visits to the ED for similar episodes related to medication noncompliance Recent head injury:  No recent head injuries PTA treatment:  None History of seizures: yes   Similar to previous episodes: yes   Severity:  Mild Seizure control level: well controlled when takes medication. Current therapy:  Levetiracetam Compliance with current therapy:  Variable   Past Medical History  Diagnosis Date  . Seizures    History reviewed. No pertinent past surgical history. No family history on file. History  Substance Use Topics  . Smoking status: Current Every Day Smoker  . Smokeless tobacco: Not on file  . Alcohol Use: Yes    Review of Systems  Constitutional: Negative for fever.  HENT: Negative for rhinorrhea and sore throat.   Eyes: Negative for pain.  Respiratory: Negative for cough.   Cardiovascular: Negative for leg swelling.  Gastrointestinal: Negative for abdominal pain.   Genitourinary: Negative for difficulty urinating.  Musculoskeletal: Negative for gait problem.  Skin: Negative for rash.  Neurological: Positive for seizures.  Hematological: Negative for adenopathy.  Psychiatric/Behavioral: Negative for agitation.    Allergies  Bee venom and Tomato  Home Medications   Current Outpatient Rx  Name  Route  Sig  Dispense  Refill  . levETIRAcetam (KEPPRA) 500 MG tablet   Oral   Take 1 tablet (500 mg total) by mouth every 12 (twelve) hours.   30 tablet   0    BP 107/50  Pulse 76  Temp(Src) 98.2 F (36.8 C) (Oral)  Resp 20  SpO2 95% Physical Exam  Nursing note and vitals reviewed. Constitutional: He is oriented to person, place, and time. He appears well-developed and well-nourished.  HENT:  Head: Normocephalic and atraumatic.  Right Ear: External ear normal.  Left Ear: External ear normal.  Eyes: EOM are normal.  Neck: Normal range of motion.  Cardiovascular: Normal rate, regular rhythm, normal heart sounds and intact distal pulses.   Pulmonary/Chest: Effort normal and breath sounds normal.  Abdominal: Soft. There is no tenderness.  Musculoskeletal: Normal range of motion.  Neurological: He is alert and oriented to person, place, and time. He has normal strength. No sensory deficit.  Skin: Skin is warm and dry.  Psychiatric: He has a normal mood and affect.    ED Course  Procedures (including critical care time) Labs Review Labs Reviewed  POCT I-STAT, CHEM 8 - Abnormal; Notable for the following:    Creatinine, Ser 1.40 (*)    Calcium, Ion 1.29 (*)    All other components within  normal limits   Imaging Review No results found.  EKG Interpretation   None       MDM   1. Seizures   2. History of medication noncompliance    21 yo male with seizure, noncompliant with meds x 2 days. Labs unchanged from patient's baseline. Loading dose of keppra administered, script refilled. Recommend f/u with a PCP and/or neurologist. No  driving until neuro approves.    Simmie Davies, NP 12/19/12 1140
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
## 2012-12-24 DIAGNOSIS — Z0289 Encounter for other administrative examinations: Secondary | ICD-10-CM

## 2013-05-11 ENCOUNTER — Encounter (HOSPITAL_COMMUNITY): Payer: Self-pay | Admitting: Emergency Medicine

## 2013-05-11 ENCOUNTER — Emergency Department (INDEPENDENT_AMBULATORY_CARE_PROVIDER_SITE_OTHER)
Admission: EM | Admit: 2013-05-11 | Discharge: 2013-05-11 | Disposition: A | Discharge status: Hospice | Payer: No Typology Code available for payment source | Source: Home / Self Care | Attending: Family Medicine | Admitting: Family Medicine

## 2013-05-11 ENCOUNTER — Emergency Department (HOSPITAL_COMMUNITY): Payer: No Typology Code available for payment source

## 2013-05-11 ENCOUNTER — Emergency Department (HOSPITAL_COMMUNITY)
Admission: EM | Admit: 2013-05-11 | Discharge: 2013-05-11 | Disposition: A | Discharge status: Home / Self Care | Payer: No Typology Code available for payment source | Attending: Emergency Medicine | Admitting: Emergency Medicine

## 2013-05-11 DIAGNOSIS — S022XXA Fracture of nasal bones, initial encounter for closed fracture: Secondary | ICD-10-CM | POA: Insufficient documentation

## 2013-05-11 DIAGNOSIS — S0993XA Unspecified injury of face, initial encounter: Secondary | ICD-10-CM

## 2013-05-11 DIAGNOSIS — F17200 Nicotine dependence, unspecified, uncomplicated: Secondary | ICD-10-CM | POA: Insufficient documentation

## 2013-05-11 DIAGNOSIS — S0081XA Abrasion of other part of head, initial encounter: Secondary | ICD-10-CM

## 2013-05-11 DIAGNOSIS — L539 Erythematous condition, unspecified: Secondary | ICD-10-CM | POA: Insufficient documentation

## 2013-05-11 DIAGNOSIS — Z23 Encounter for immunization: Secondary | ICD-10-CM | POA: Insufficient documentation

## 2013-05-11 DIAGNOSIS — Z91018 Allergy to other foods: Secondary | ICD-10-CM | POA: Insufficient documentation

## 2013-05-11 DIAGNOSIS — S01311A Laceration without foreign body of right ear, initial encounter: Secondary | ICD-10-CM

## 2013-05-11 DIAGNOSIS — R569 Unspecified convulsions: Secondary | ICD-10-CM | POA: Insufficient documentation

## 2013-05-11 DIAGNOSIS — Z91038 Other insect allergy status: Secondary | ICD-10-CM | POA: Insufficient documentation

## 2013-05-11 DIAGNOSIS — IMO0002 Reserved for concepts with insufficient information to code with codable children: Secondary | ICD-10-CM | POA: Insufficient documentation

## 2013-05-11 DIAGNOSIS — S199XXA Unspecified injury of neck, initial encounter: Secondary | ICD-10-CM

## 2013-05-11 DIAGNOSIS — Z79899 Other long term (current) drug therapy: Secondary | ICD-10-CM | POA: Insufficient documentation

## 2013-05-11 DIAGNOSIS — J3489 Other specified disorders of nose and nasal sinuses: Secondary | ICD-10-CM | POA: Insufficient documentation

## 2013-05-11 DIAGNOSIS — S0083XA Contusion of other part of head, initial encounter: Secondary | ICD-10-CM

## 2013-05-11 DIAGNOSIS — S01309A Unspecified open wound of unspecified ear, initial encounter: Secondary | ICD-10-CM | POA: Insufficient documentation

## 2013-05-11 MED ORDER — TRAMADOL HCL 50 MG PO TABS
50.0000 mg | ORAL_TABLET | Freq: Once | ORAL | Status: AC
  Administered 2013-05-11: 50 mg via ORAL
  Filled 2013-05-11: qty 1

## 2013-05-11 MED ORDER — TETANUS-DIPHTH-ACELL PERTUSSIS 5-2.5-18.5 LF-MCG/0.5 IM SUSP
0.5000 mL | Freq: Once | INTRAMUSCULAR | Status: AC
  Administered 2013-05-11: 0.5 mL via INTRAMUSCULAR
  Filled 2013-05-11: qty 0.5

## 2013-05-11 MED ORDER — OXYCODONE-ACETAMINOPHEN 5-325 MG PO TABS
1.0000 | ORAL_TABLET | Freq: Once | ORAL | Status: AC
  Administered 2013-05-11: 1 via ORAL
  Filled 2013-05-11: qty 1

## 2013-05-11 MED ORDER — TRAMADOL HCL 50 MG PO TABS: 50.0000 mg | ORAL_TABLET | Freq: Four times a day (QID) | ORAL | Status: DC | PRN
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
## 2013-05-11 NOTE — Discharge Instructions (Signed)
Facial or Scalp Contusion A facial or scalp contusion is a deep bruise on the face or head. Injuries to the face and head generally cause a lot of swelling, especially around the eyes. Contusions are the result of an injury that caused bleeding under the skin. The contusion may turn blue, purple, or yellow. Minor injuries will give you a painless contusion, but more severe contusions may stay painful and swollen for a few weeks.  CAUSES  A facial or scalp contusion is caused by a blunt injury or trauma to the face or head area.  SIGNS AND SYMPTOMS   Swelling of the injured area.   Discoloration of the injured area.   Tenderness, soreness, or pain in the injured area.  DIAGNOSIS  The diagnosis can be made by taking a medical history and doing a physical exam. An X-ray exam, CT scan, or MRI may be needed to determine if there are any associated injuries, such as broken bones (fractures). TREATMENT  Often, the best treatment for a facial or scalp contusion is applying cold compresses to the injured area. Over-the-counter medicines may also be recommended for pain control.  HOME CARE INSTRUCTIONS   Only take over-the-counter or prescription medicines as directed by your health care provider.   Apply ice to the injured area.   Put ice in a plastic bag.   Place a towel between your skin and the bag.   Leave the ice on for 20 minutes, 2 3 times a day.  SEEK MEDICAL CARE IF:  You have bite problems.   You have pain with chewing.   You are concerned about facial defects. SEEK IMMEDIATE MEDICAL CARE IF:  You have severe pain or a headache that is not relieved by medicine.   You have unusual sleepiness, confusion, or personality changes.   You throw up (vomit).   You have a persistent nosebleed.   You have double vision or blurred vision.   You have fluid drainage from your nose or ear.   You have difficulty walking or using your arms or legs.  MAKE SURE YOU:    Understand these instructions.  Will watch your condition.  Will get help right away if you are not doing well or get worse. Document Released: 02/11/2004 Document Revised: 10/24/2012 Document Reviewed: 08/16/2012 [facility] Patient Information 2014 [place], [place].   Facial Laceration  A facial laceration is a cut on the face. These injuries can be painful and cause bleeding. Lacerations usually heal quickly, but they need special care to reduce scarring. DIAGNOSIS  Your health care provider will take a medical history, ask for details about how the injury occurred, and examine the wound to determine how deep the cut is. TREATMENT  Some facial lacerations may not require closure. Others may not be able to be closed because of an increased risk of infection. The risk of infection and the chance for successful closure will depend on various factors, including the amount of time since the injury occurred. The wound may be cleaned to help prevent infection. If closure is appropriate, pain medicines may be given if needed. Your health care provider will use stitches (sutures), wound glue (adhesive), or skin adhesive strips to repair the laceration. These tools bring the skin edges together to allow for faster healing and a better cosmetic outcome. If needed, you may also be given a tetanus shot. HOME CARE INSTRUCTIONS  Only take over-the-counter or prescription medicines as directed by your health care provider.  Follow your health care  provider's instructions for wound care. These instructions will vary depending on the technique used for closing the wound. For Sutures:  Keep the wound clean and dry.   If you were given a bandage (dressing), you should change it at least once a day. Also change the dressing if it becomes wet or dirty, or as directed by your health care provider.   Wash the wound with soap and water 2 times a day. Rinse the wound off with water to remove all soap. Pat the  wound dry with a clean towel.   After cleaning, apply a thin layer of the antibiotic ointment recommended by your health care provider. This will help prevent infection and keep the dressing from sticking.   You may shower as usual after the first 24 hours. Do not soak the wound in water until the sutures are removed.   Get your sutures removed as directed by your health care provider. With facial lacerations, sutures should usually be taken out after 4 5 days to avoid stitch marks.   Wait a few days after your sutures are removed before applying any makeup. For Skin Adhesive Strips:  Keep the wound clean and dry.   Do not get the skin adhesive strips wet. You may bathe carefully, using caution to keep the wound dry.   If the wound gets wet, pat it dry with a clean towel.   Skin adhesive strips will fall off on their own. You may trim the strips as the wound heals. Do not remove skin adhesive strips that are still stuck to the wound. They will fall off in time.  For Wound Adhesive:  You may briefly wet your wound in the shower or bath. Do not soak or scrub the wound. Do not swim. Avoid periods of heavy sweating until the skin adhesive has fallen off on its own. After showering or bathing, gently pat the wound dry with a clean towel.   Do not apply liquid medicine, cream medicine, ointment medicine, or makeup to your wound while the skin adhesive is in place. This may loosen the film before your wound is healed.   If a dressing is placed over the wound, be careful not to apply tape directly over the skin adhesive. This may cause the adhesive to be pulled off before the wound is healed.   Avoid prolonged exposure to sunlight or tanning lamps while the skin adhesive is in place.  The skin adhesive will usually remain in place for 5 10 days, then naturally fall off the skin. Do not pick at the adhesive film.  After Healing: Once the wound has healed, cover the wound with  sunscreen during the day for 1 full year. This can help minimize scarring. Exposure to ultraviolet light in the first year will darken the scar. It can take 1 2 years for the scar to lose its redness and to heal completely.  SEEK IMMEDIATE MEDICAL CARE IF:  You have redness, pain, or swelling around the wound.   You see ayellowish-white fluid (pus) coming from the wound.   You have chills or a fever.  MAKE SURE YOU:  Understand these instructions.  Will watch your condition.  Will get help right away if you are not doing well or get worse. Document Released: 02/11/2004 Document Revised: 10/24/2012 Document Reviewed: 08/16/2012 [facility] Patient Information 2014 [place], [place].  Assault, General Assault includes any behavior, whether intentional or reckless, which results in bodily injury to another person and/or damage to property. Included  in this would be any behavior, intentional or reckless, that by its nature would be understood (interpreted) by a reasonable person as intent to harm another person or to damage his/her property. Threats may be oral or written. They may be communicated through regular mail, computer, fax, or phone. These threats may be direct or implied. FORMS OF ASSAULT INCLUDE:  Physically assaulting a person. This includes physical threats to inflict physical harm as well as:  Slapping.  Hitting.  Poking.  Kicking.  Punching.  Pushing.  Arson.  Sabotage.  Equipment vandalism.  Damaging or destroying property.  Throwing or hitting objects.  Displaying a weapon or an object that appears to be a weapon in a threatening manner.  Carrying a firearm of any kind.  Using a weapon to harm someone.  Using greater physical size/strength to intimidate another.  Making intimidating or threatening gestures.  Bullying.  Hazing.  Intimidating, threatening, hostile, or abusive language directed toward another person.  It communicates the  intention to engage in violence against that person. And it leads a reasonable person to expect that violent behavior may occur.  Stalking another person. IF IT HAPPENS AGAIN:  Immediately call for emergency help (911 in [place]  If someone poses clear and immediate danger to you, seek legal authorities to have a protective or restraining order put in place.  Less threatening assaults can at least be reported to authorities. STEPS TO TAKE IF A SEXUAL ASSAULT HAS HAPPENED  Go to an area of safety. This may include a shelter or staying with a friend. Stay away from the area where you have been attacked. A large percentage of sexual assaults are caused by a friend, relative or associate.  If medications were given by your caregiver, take them as directed for the full length of time prescribed.  Only take over-the-counter or prescription medicines for pain, discomfort, or fever as directed by your caregiver.  If you have come in contact with a sexual disease, find out if you are to be tested again. If your caregiver is concerned about the HIV/AIDS virus, he/she may require you to have continued testing for several months.  For the protection of your privacy, test results can not be given over the phone. Make sure you receive the results of your test. If your test results are not back during your visit, make an appointment with your caregiver to find out the results. Do not assume everything is normal if you have not heard from your caregiver or the medical facility. It is important for you to follow up on all of your test results.  File appropriate papers with authorities. This is important in all assaults, even if it has occurred in a family or by a friend. SEEK MEDICAL CARE IF:  You have new problems because of your injuries.  You have problems that may be because of the medicine you are taking, such as:  Rash.  Itching.  Swelling.  Trouble breathing.  You develop belly (abdominal)  pain, feel sick to your stomach (nausea) or are vomiting.  You begin to run a temperature.  You need supportive care or referral to a rape crisis center. These are centers with trained personnel who can help you get through this ordeal. SEEK IMMEDIATE MEDICAL CARE IF:  You are afraid of being threatened, beaten, or abused. In [place] call 911.  You receive new injuries related to abuse.  You develop severe pain in any area injured in the assault or have any change  in your condition that concerns you.  You faint or lose consciousness.  You develop chest pain or shortness of breath. Document Released: 01/03/2005 Document Revised: 03/28/2011 Document Reviewed: 08/22/2007 [facility] Patient Information 2014 [place], [place].

## 2013-05-12 NOTE — ED Provider Notes (Signed)
Medical screening examination/treatment/procedure(s) were performed by non-physician practitioner and as supervising physician I was immediately available for consultation/collaboration.  Brandon PoundMichael Y. Oletta LamasGhim, MD 05/12/13 [phone]

## 2013-05-25 ENCOUNTER — Encounter (HOSPITAL_COMMUNITY): Payer: Self-pay | Admitting: Emergency Medicine

## 2013-05-25 ENCOUNTER — Emergency Department (HOSPITAL_COMMUNITY)
Admission: EM | Admit: 2013-05-25 | Discharge: 2013-05-25 | Disposition: A | Discharge status: Home / Self Care | Payer: No Typology Code available for payment source | Attending: Emergency Medicine | Admitting: Emergency Medicine

## 2013-05-25 DIAGNOSIS — G40909 Epilepsy, unspecified, not intractable, without status epilepticus: Secondary | ICD-10-CM | POA: Insufficient documentation

## 2013-05-25 DIAGNOSIS — F17200 Nicotine dependence, unspecified, uncomplicated: Secondary | ICD-10-CM | POA: Insufficient documentation

## 2013-05-25 DIAGNOSIS — R51 Headache: Secondary | ICD-10-CM | POA: Insufficient documentation

## 2013-05-25 DIAGNOSIS — R519 Headache, unspecified: Secondary | ICD-10-CM

## 2013-05-25 DIAGNOSIS — Z760 Encounter for issue of repeat prescription: Secondary | ICD-10-CM | POA: Insufficient documentation

## 2013-05-25 MED ORDER — SODIUM CHLORIDE 0.9 % IV SOLN
Freq: Once | INTRAVENOUS | Status: AC
  Administered 2013-05-25: 16:00:00 via INTRAVENOUS

## 2013-05-25 MED ORDER — IBUPROFEN 800 MG PO TABS
800.0000 mg | ORAL_TABLET | Freq: Once | ORAL | Status: AC
  Administered 2013-05-25: 800 mg via ORAL
  Filled 2013-05-25: qty 1

## 2013-05-25 MED ORDER — LEVETIRACETAM 500 MG/5ML IV SOLN
1000.0000 mg | Freq: Once | INTRAVENOUS | Status: AC
  Administered 2013-05-25: 1000 mg via INTRAVENOUS
  Filled 2013-05-25: qty 10

## 2013-05-25 MED ORDER — LEVETIRACETAM 500 MG PO TABS
500.0000 mg | ORAL_TABLET | Freq: Two times a day (BID) | ORAL | Status: DC
Start: 2013-05-25 — End: 2014-08-29
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
## 2013-05-25 NOTE — ED Notes (Signed)
Called report to GraeagleBecky, RN POD C.  [id number] dose of Keppra and then discharge.

## 2013-05-25 NOTE — ED Provider Notes (Signed)
CSN: [id number]     Arrival date & time 05/25/13  1358 History  This chart was scribed for non-physician practitioner working with [name], MD by Ashley JacobsBrittany Andrews, ED scribe. This patient was seen in room TR05C/TR05C and the patient's care was started at 3:06 PM.  First MD Initiated Contact with Patient 05/25/13 1420     Chief Complaint  Patient presents with  . Medication Refill  . Headache    (Consider location/radiation/quality/duration/timing/severity/associated sxs/prior Treatment) HPI HPI Comments: Brandon Guerrero is a 23 y.o. male with hx of epilepsy who presents to the Emergency Department complaining of a constant pounding/throbbing, frontal  headache that started this morning. Pt request a mediation refill for generic Keppra 500 mg (twice a day), his last dose was three days ago.He normally goes to Urgent Care for a medication refill. Pt has been taking pieces of his Keppra to sustain himself until his next refill.  Pt felt as though he was about to seize today due to not taking his medication. Earlier, he started seeing spots and his body started jerking for a few minutes while in his kitchen. The episode did not go away so he smoked marijuana to calm his nerves.  He is still intermittently visualizing spots.  Prior to having a seizure he sometimes has a headache, jerking of his arms, and seeing spots.  Denies numbness and weakness. Denies incontinence of stool and tongue biting. Denies vision loss, LOC,nausea, vomiting, and difficulty speaking.He was a pt at [facility]. However, he lost insurance and was unable to see is neurologist. Pt speculates that he will have a seizure if he has any pain medication prior to getting Keppra.    Past Medical History  Diagnosis Date  . Seizures    History reviewed. No pertinent past surgical history. No family history on file. History  Substance Use Topics  . Smoking status: Current Every Day Smoker    Types: Cigarettes  .  Smokeless tobacco: Not on file  . Alcohol Use: Yes    Review of Systems  Eyes: Negative for visual disturbance.  Gastrointestinal: Negative for nausea and vomiting.       No Bowel incontinence   Neurological: Positive for headaches. Negative for seizures, syncope, speech difficulty, weakness and numbness.       "jerking"  All other systems reviewed and are negative.    Allergies  Bee venom and Tomato  Home Medications   Prior to Admission medications   Medication Sig Start Date End Date Taking? Authorizing Provider  acetaminophen (TYLENOL) 500 MG tablet Take 500 mg by mouth every 6 (six) hours as needed for mild pain or moderate pain.    Historical Provider, MD  levETIRAcetam (KEPPRA) 500 MG tablet Take 1 tablet (500 mg total) by mouth every 12 (twelve) hours. 12/19/12   Simmie DaviesAngela M Muller, NP  traMADol (ULTRAM) 50 MG tablet Take 1 tablet (50 mg total) by mouth every 6 (six) hours as needed. 05/11/13   Tiffany Irine SealG Greene, PA-C   BP 120/73  Pulse 91  Temp(Src) 98.5 F (36.9 C) (Oral)  Resp 18  Wt 209 lb 3 oz (94.887 kg)  SpO2 98%  Physical Exam  Nursing note and vitals reviewed. Constitutional: He is oriented to person, place, and time. He appears well-developed and well-nourished. No distress.  HENT:  Head: Normocephalic and atraumatic.  Mouth/Throat: Oropharynx is clear and moist. No oropharyngeal exudate.  Eyes: Conjunctivae and EOM are normal. Pupils are equal, round, and reactive to light. No  scleral icterus.  Neck: Normal range of motion. Neck supple.  Cardiovascular: Normal rate, regular rhythm and intact distal pulses.   Distal radial pulse 2+ bilaterally  Pulmonary/Chest: Effort normal. No respiratory distress.  Musculoskeletal: Normal range of motion.  Neurological: He is alert and oriented to person, place, and time. He has normal reflexes. No cranial nerve deficit. He exhibits normal muscle tone. Coordination normal.  GCS 15. Speech is goal oriented. No cranial nerve  deficits appreciated; symmetric eyebrow raise, no facial drooping. Patient has normal and equal grip strength bilaterally. Normal sensation to light touch. Patient ambulates with normal gait.  Skin: Skin is warm and dry. No rash noted. He is not diaphoretic. No erythema. No pallor.  Psychiatric: He has a normal mood and affect. His behavior is normal.    ED Course  Procedures (including critical care time)  COORDINATION OF CARE:  2:30 PM Discussed course of care with pt . Pt understands and agrees.   Labs Review Labs Reviewed - No data to display  Imaging Review No results found.   EKG Interpretation None      MDM   Final diagnoses:  Encounter for medication refill  Headache    23 year old male presents to the emergency department as he is out of his Keppra medication. Patient states that he takes Keppra 500 mg BID. He states this is the third day of him being out of his medication. The last 2 days, he states he has been cutting his medication up in smaller amounts and administering it to himself to "keep some of the medication in my system". Patient denies any seizure activity, but states he has been experiencing for is consistent with symptoms prior to seizure onset. Patient with a normal, nonfocal neurologic exam today.  Have discussed the patient's case with Dr. Thad Rangereynolds of neurology. Will load patient with 1 g IV Keppra. If no seizure activity while in the ED and stable neurologic state, anticipate discharge home with prescription for Keppra 500 mg BID tab #60. Patient signed out to Fayrene HelperBowie Tran, PA-C at shift change who will disposition for d/c upon completion of Keppra load if no complications.   Filed Vitals:   05/25/13 1411 05/25/13 1629  BP: 120/73 108/67  Pulse: 91   Temp: 98.5 F (36.9 C)   TempSrc: Oral   Resp: 18 18  Weight: 209 lb 3 oz (94.887 kg)   SpO2: 98% 99%    I personally performed the services described in this documentation, which was scribed in my  presence. The recorded information has been reviewed and is accurate.    Antony MaduraKelly Nichalas Coin, PA-C 05/25/13 [phone]

## 2013-05-25 NOTE — ED Provider Notes (Signed)
Medical screening examination/treatment/procedure(s) were performed by non-physician practitioner and as supervising physician I was immediately available for consultation/collaboration.   EKG Interpretation None        William Antrice Pal, MD 05/25/13 2301 

## 2013-05-25 NOTE — ED Notes (Signed)
Pt. Stated, Im out of my seizure medicine 

## 2013-05-25 NOTE — Discharge Instructions (Signed)
Epilepsy °Epilepsy is a disorder in which a person has repeated seizures over time. A seizure is a release of abnormal electrical activity in the brain. Seizures can cause a change in attention, behavior, or the ability to remain awake and alert (altered mental status). Seizures often involve uncontrollable shaking (convulsions).  °Most people with epilepsy lead normal lives. However, people with epilepsy are at an increased risk of falls, accidents, and injuries. Therefore, it is important to begin treatment right away. °CAUSES  °Epilepsy has many possible causes. Anything that disturbs the normal pattern of brain cell activity can lead to seizures. This may include:  °· Head injury. °· Birth trauma. °· High fever as a child. °· Stroke. °· Bleeding into or around the brain. °· Certain drugs. °· Prolonged low oxygen, such as what occurs after CPR efforts. °· Abnormal brain development. °· Certain illnesses, such as meningitis, encephalitis (brain infection), malaria, and other infections. °· An imbalance of nerve signaling chemicals (neurotransmitters).   °SIGNS AND SYMPTOMS  °The symptoms of a seizure can vary greatly from one person to another. Right before a seizure, you may have a warning (aura) that a seizure is about to occur. An aura may include the following symptoms: °· Fear or anxiety. °· Nausea. °· Feeling like the room is spinning (vertigo). °· Vision changes, such as seeing flashing lights or spots. °Common symptoms during a seizure include: °· Abnormal sensations, such as an abnormal smell or a bitter taste in the mouth.   °· Sudden, general body stiffness.   °· Convulsions that involve rhythmic jerking of the face, arm, or leg on one or both sides.   °· Sudden change in consciousness.   °· Appearing to be awake but not responding.   °· Appearing to be asleep but cannot be awakened.   °· Grimacing, chewing, lip smacking, drooling, tongue biting, or loss of bowel or bladder control. °After a seizure,  you may feel sleepy for a while.  °DIAGNOSIS  °Your health care provider will ask about your symptoms and take a medical history. Descriptions from any witnesses to your seizures will be very helpful in the diagnosis. A physical exam, including a detailed neurological exam, is necessary. Various tests may be done, such as:  °· An electroencephalogram (EEG). This is a painless test of your brain waves. In this test, a diagram is created of your brain waves. These diagrams can be interpreted by a specialist. °· An MRI of the brain.   °· A CT scan of the brain.   °· A spinal tap (lumbar puncture, LP). °· Blood tests to check for signs of infection or abnormal blood chemistry. °TREATMENT  °There is no cure for epilepsy, but it is generally treatable. Once epilepsy is diagnosed, it is important to begin treatment as soon as possible. For most people with epilepsy, seizures can be controlled with medicines. The following may also be used: °· A pacemaker for the brain (vagus nerve stimulator) can be used for people with seizures that are not well controlled by medicine. °· Surgery on the brain. °For some people, epilepsy eventually goes away. °HOME CARE INSTRUCTIONS  °· Follow your health care provider's recommendations on driving and safety in normal activities. °· Get enough rest. Lack of sleep can cause seizures. °· Only take over-the-counter or prescription medicines as directed by your health care provider. Take any prescribed medicine exactly as directed. °· Avoid any known triggers of your seizures. °· Keep a seizure diary. Record what you recall about any seizure, especially any possible trigger.   °· Make   sure the people you live and work with know that you are prone to seizures. They should receive instructions on how to help you. In general, a witness to a seizure should:   Cushion your head and body.   Turn you on your side.   Avoid unnecessarily restraining you.   Not place anything inside your  mouth.   Call for emergency medical help if there is any question about what has occurred.   Follow up with your health care provider as directed. You may need regular blood tests to monitor the levels of your medicine.  SEEK MEDICAL CARE IF:   You develop signs of infection or other illness. This might increase the risk of a seizure.   You seem to be having more frequent seizures.   Your seizure pattern is changing.  SEEK IMMEDIATE MEDICAL CARE IF:   You have a seizure that does not stop after a few moments.   You have a seizure that causes any difficulty in breathing.   You have a seizure that results in a very severe headache.   You have a seizure that leaves you with the inability to speak or use a part of your body.  Document Released: 01/03/2005 Document Revised: 10/24/2012 Document Reviewed: 08/15/2012 [facility] Patient Information 2014 [place], [place].  Medication Refill, Emergency Department We have refilled your medication today as a courtesy to you. It is best for your medical care, however, to take care of getting refills done through your primary caregiver's office. They have your records and can do a better job of follow-up than we can in the emergency department. On maintenance medications, we often only prescribe enough medications to get you by until you are able to see your regular caregiver. This is a more expensive way to refill medications. In the future, please plan for refills so that you will not have to use the emergency department for this. Thank you for your help. Your help allows us to better take care of the daily emergencies that enter our department. Document Released: 04/22/2003 Document Revised: 03/28/2011 Document Reviewed: 01/03/2005 [facility] Patient Information 2014 [place], [place].

## 2013-05-25 NOTE — ED Notes (Signed)
Pt. Stated, I also started having a headache this morning, i guess from being out of my medicine

## 2013-05-27 ENCOUNTER — Encounter (HOSPITAL_COMMUNITY): Payer: Self-pay | Admitting: Emergency Medicine

## 2013-05-27 ENCOUNTER — Emergency Department (HOSPITAL_COMMUNITY)
Admission: EM | Admit: 2013-05-27 | Discharge: 2013-05-27 | Disposition: A | Discharge status: Home / Self Care | Payer: No Typology Code available for payment source | Attending: Emergency Medicine | Admitting: Emergency Medicine

## 2013-05-27 DIAGNOSIS — R079 Chest pain, unspecified: Secondary | ICD-10-CM | POA: Insufficient documentation

## 2013-05-27 DIAGNOSIS — Z79899 Other long term (current) drug therapy: Secondary | ICD-10-CM | POA: Insufficient documentation

## 2013-05-27 DIAGNOSIS — G40309 Generalized idiopathic epilepsy and epileptic syndromes, not intractable, without status epilepticus: Secondary | ICD-10-CM | POA: Insufficient documentation

## 2013-05-27 DIAGNOSIS — F17200 Nicotine dependence, unspecified, uncomplicated: Secondary | ICD-10-CM | POA: Insufficient documentation

## 2013-05-27 DIAGNOSIS — R569 Unspecified convulsions: Secondary | ICD-10-CM

## 2013-05-27 LAB — CBC WITH DIFFERENTIAL/PLATELET
BASOS PCT: 1 % (ref 0–1)
Basophils Absolute: 0 10*3/uL (ref 0.0–0.1)
EOS PCT: 2 % (ref 0–5)
Eosinophils Absolute: 0.1 10*3/uL (ref 0.0–0.7)
HEMATOCRIT: 45.1 % (ref 39.0–52.0)
Hemoglobin: 15.4 g/dL (ref 13.0–17.0)
Lymphocytes Relative: 31 % (ref 12–46)
Lymphs Abs: 1.8 10*3/uL (ref 0.7–4.0)
MCH: 30.6 pg (ref 26.0–34.0)
MCHC: 34.1 g/dL (ref 30.0–36.0)
MCV: 89.5 fL (ref 78.0–100.0)
MONO ABS: 0.3 10*3/uL (ref 0.1–1.0)
Monocytes Relative: 5 % (ref 3–12)
Neutro Abs: 3.7 10*3/uL (ref 1.7–7.7)
Neutrophils Relative %: 61 % (ref 43–77)
Platelets: 260 10*3/uL (ref 150–400)
RBC: 5.04 MIL/uL (ref 4.22–5.81)
RDW: 12.9 % (ref 11.5–15.5)
WBC: 6 10*3/uL (ref 4.0–10.5)

## 2013-05-27 LAB — BASIC METABOLIC PANEL
BUN: 10 mg/dL (ref 6–23)
CALCIUM: 8.8 mg/dL (ref 8.4–10.5)
CO2: 22 meq/L (ref 19–32)
CREATININE: 1.39 mg/dL — AB (ref 0.50–1.35)
Chloride: 100 mEq/L (ref 96–112)
GFR calc non Af Amer: 70 mL/min — ABNORMAL LOW (ref >90)
GFR, EST AFRICAN AMERICAN: 81 mL/min — AB (ref >90)
Glucose, Bld: 98 mg/dL (ref 70–99)
Potassium: 3.5 mEq/L — ABNORMAL LOW (ref 3.7–5.3)
Sodium: 140 mEq/L (ref 137–147)

## 2013-05-27 LAB — CBG MONITORING, ED: Glucose-Capillary: 107 mg/dL — ABNORMAL HIGH (ref 70–99)

## 2013-05-27 MED ORDER — IBUPROFEN 800 MG PO TABS
800.0000 mg | ORAL_TABLET | Freq: Once | ORAL | Status: AC
  Administered 2013-05-27: 800 mg via ORAL
  Filled 2013-05-27: qty 1

## 2013-05-27 MED ORDER — LEVETIRACETAM 500 MG/5ML IV SOLN
500.0000 mg | Freq: Once | INTRAVENOUS | Status: AC
  Administered 2013-05-27: 500 mg via INTRAVENOUS
  Filled 2013-05-27: qty 5

## 2013-05-27 NOTE — Discharge Instructions (Signed)
Continue your Keppra as prescribed.   Followup with your primary Dr. in the next week   Seizure, Adult A seizure is abnormal electrical activity in the brain. Seizures usually last from 30 seconds to 2 minutes. There are various types of seizures. Before a seizure, you may have a warning sensation (aura) that a seizure is about to occur. An aura may include the following symptoms:   Fear or anxiety.  Nausea.  Feeling like the room is spinning (vertigo).  Vision changes, such as seeing flashing lights or spots. Common symptoms during a seizure include:  A change in attention or behavior (altered mental status).  Convulsions with rhythmic jerking movements.  Drooling.  Rapid eye movements.  Grunting.  Loss of bladder and bowel control.  Bitter taste in the mouth.  Tongue biting. After a seizure, you may feel confused and sleepy. You may also have an injury resulting from convulsions during the seizure. HOME CARE INSTRUCTIONS   If you are given medicines, take them exactly as prescribed by your health care provider.  Keep all follow-up appointments as directed by your health care provider.  Do not swim or drive or engage in risky activity during which a seizure could cause further injury to you or others until your health care provider says it is OK.  Get adequate rest.  Teach friends and family what to do if you have a seizure. They should:  Lay you on the ground to prevent a fall.  Put a cushion under your head.  Loosen any tight clothing around your neck.  Turn you on your side. If vomiting occurs, this helps keep your airway clear.  Stay with you until you recover.  Know whether or not you need emergency care. SEEK IMMEDIATE MEDICAL CARE IF:  The seizure lasts longer than 5 minutes.  The seizure is severe or you do not wake up immediately after the seizure.  You have an altered mental status after the seizure.  You are having more frequent or  worsening seizures. Someone should drive you to the emergency department or call local emergency services (911 in [place] MAKE SURE YOU:  Understand these instructions.  Will watch your condition.  Will get help right away if you are not doing well or get worse. Document Released: 01/01/2000 Document Revised: 10/24/2012 Document Reviewed: 08/15/2012 [facility] Patient Information 2014 [place]ExitCare, [place]LL[place].

## 2013-05-27 NOTE — ED Provider Notes (Signed)
CSN: [id number]     Arrival date & time 05/27/13  0007 History   None    Chief Complaint  Patient presents with  . Seizures     (Consider location/radiation/quality/duration/timing/severity/associated sxs/prior Treatment) HPI Comments: Patient is a 23 year old male with history of seizures. He is brought today by authorities after an apparent seizure like episode. He was in police custody and began shaking. He was then brought here for evaluation of this. He reports headache and chest pain. He reports that he was slammed to the ground and roughed up by the police officers. He also tells me he has not taken his Keppra for several days because "a male he has been messing with took this from him". He tells me his brother was able to retrieve this medication and this is why he has not had it in several days. He has been uncooperative with police officers.  Patient is a 23 y.o. male presenting with seizures. The history is provided by the patient.  Seizures Seizure activity on arrival: no   Seizure type:  Grand mal Initial focality:  None Return to baseline: yes   Severity:  Moderate Duration:  1 minute Timing:  Once   Past Medical History  Diagnosis Date  . Seizures    History reviewed. No pertinent past surgical history. No family history on file. History  Substance Use Topics  . Smoking status: Current Every Day Smoker    Types: Cigarettes  . Smokeless tobacco: Not on file  . Alcohol Use: Yes    Review of Systems  Neurological: Positive for seizures.  All other systems reviewed and are negative.     Allergies  Bee venom and Tomato  Home Medications   Prior to Admission medications   Medication Sig Start Date End Date Taking? Authorizing Provider  acetaminophen (TYLENOL) 500 MG tablet Take 500 mg by mouth every 6 (six) hours as needed for mild pain or moderate pain.    Historical Provider, MD  levETIRAcetam (KEPPRA) 500 MG tablet Take 1 tablet (500 mg total) by  mouth every 12 (twelve) hours. 05/25/13   Antony MaduraKelly Humes, PA-C  levETIRAcetam (KEPPRA) 500 MG tablet Take 500 mg by mouth 2 (two) times daily.    Historical Provider, MD  traMADol (ULTRAM) 50 MG tablet Take 50 mg by mouth every 6 (six) hours as needed for moderate pain. 05/11/13   Dorthula Matasiffany G Greene, PA-C   There were no vitals taken for this visit. Physical Exam  Nursing note and vitals reviewed. Constitutional: He is oriented to person, place, and time. He appears well-developed and well-nourished. No distress.  HENT:  Head: Normocephalic and atraumatic.  Mouth/Throat: Oropharynx is clear and moist.  Eyes: EOM are normal. Pupils are equal, round, and reactive to light.  Neck: Normal range of motion. Neck supple.  Cardiovascular: Normal rate, regular rhythm and normal heart sounds.   No murmur heard. Pulmonary/Chest: Effort normal and breath sounds normal. No respiratory distress. He has no wheezes.  Abdominal: Soft. Bowel sounds are normal. He exhibits no distension. There is no tenderness.  Musculoskeletal: Normal range of motion. He exhibits no edema.  Lymphadenopathy:    He has no cervical adenopathy.  Neurological: He is alert and oriented to person, place, and time. No cranial nerve deficit. He exhibits normal muscle tone. Coordination normal.  Skin: Skin is warm and dry. He is not diaphoretic.    ED Course  Procedures (including critical care time) Labs Review Labs Reviewed  CBG MONITORING, ED - Abnormal;  Notable for the following:    Glucose-Capillary 107 (*)    All other components within normal limits    Imaging Review No results found.   EKG Interpretation   Date/Time:  Monday May 27 2013 00:16:48 EDT Ventricular Rate:  105 PR Interval:  166 QRS Duration: 100 QT Interval:  324 QTC Calculation: 428 R Axis:   70 Text Interpretation:  Sinus tachycardia ST elev, probable normal early  repol pattern No significant change since 06/15/12 Confirmed by DELOS  MD,  Latronda Spink  [phone]([id number]) on 05/27/2013 12:30:29 AM      MDM   Final diagnoses:  None    Patient was brought here by police after experiencing a "seizure" while in the back of the police cruiser while under arrest. He is neurologically intact and does not appear to be post ictal. He tells me he has not taken his Keppra in several days because it was taken by his male acquaintance. He has 2 bottles of this with him which does not seem to be consistent with what he is telling me. He told me that his brother was able to retrieve this medication back from her, however he has not taken it. He was given an IV dose of Keppra in the ER and appears stable for discharge.    Geoffery Lyonsouglas Thu Baggett, MD 05/27/13 [phone]

## 2013-05-27 NOTE — ED Notes (Signed)
Pt to ED via [facility]- pt was in handcuffs in the back of a cop car when he began having jerking motions, able to answer questions and speak in full sentences throughout incident.  [employer] at bedside due to pt being uncooperative- currently alert and oriented X 4.

## 2013-12-01 ENCOUNTER — Encounter (HOSPITAL_COMMUNITY): Payer: Self-pay | Admitting: *Deleted

## 2013-12-01 ENCOUNTER — Emergency Department (HOSPITAL_COMMUNITY)
Admission: EM | Admit: 2013-12-01 | Discharge: 2013-12-01 | Disposition: A | Discharge status: Home / Self Care | Payer: No Typology Code available for payment source | Attending: Emergency Medicine | Admitting: Emergency Medicine

## 2013-12-01 DIAGNOSIS — G40909 Epilepsy, unspecified, not intractable, without status epilepticus: Secondary | ICD-10-CM | POA: Insufficient documentation

## 2013-12-01 DIAGNOSIS — Z79899 Other long term (current) drug therapy: Secondary | ICD-10-CM | POA: Insufficient documentation

## 2013-12-01 DIAGNOSIS — R569 Unspecified convulsions: Secondary | ICD-10-CM

## 2013-12-01 DIAGNOSIS — Z720 Tobacco use: Secondary | ICD-10-CM | POA: Insufficient documentation

## 2013-12-01 LAB — I-STAT CHEM 8, ED
BUN: 11 mg/dL (ref 6–23)
CREATININE: 1.3 mg/dL (ref 0.50–1.35)
Calcium, Ion: 1.15 mmol/L (ref 1.12–1.23)
Chloride: 104 mEq/L (ref 96–112)
Glucose, Bld: 110 mg/dL — ABNORMAL HIGH (ref 70–99)
HCT: 53 % — ABNORMAL HIGH (ref 39.0–52.0)
HEMOGLOBIN: 18 g/dL — AB (ref 13.0–17.0)
Potassium: 4.4 mEq/L (ref 3.7–5.3)
Sodium: 139 mEq/L (ref 137–147)
TCO2: 22 mmol/L (ref 0–100)

## 2013-12-01 MED ORDER — HYDROCODONE-ACETAMINOPHEN 5-325 MG PO TABS
1.0000 | ORAL_TABLET | Freq: Once | ORAL | Status: AC
  Administered 2013-12-01: 1 via ORAL
  Filled 2013-12-01: qty 1

## 2013-12-01 MED ORDER — LEVETIRACETAM 500 MG PO TABS: 500.0000 mg | ORAL_TABLET | Freq: Two times a day (BID) | ORAL | Status: DC

## 2013-12-01 MED ORDER — LEVETIRACETAM IN NACL 500 MG/100ML IV SOLN
500.0000 mg | Freq: Once | INTRAVENOUS | Status: AC
  Administered 2013-12-01: 500 mg via INTRAVENOUS
  Filled 2013-12-01: qty 100

## 2013-12-01 NOTE — ED Notes (Signed)
Had non witnessed seizure.  Fall in shower,  Lethargic and confused and n/v on EMS arrival.  No incontinence.  EMS reports c/o neck pain and back pain. C collar applied by EMS, 3 weeks without Keppra.

## 2013-12-01 NOTE — ED Provider Notes (Signed)
TIME SEEN: 2:10 PM  CHIEF COMPLAINT: seizure  HPI: Pt is a 23 y.o. M with history of epilepsy he was on Keppra 500 mg twice daily who has been off this medication for the past 3 weeks due to financial concerns to [facility] who presents to the emergency department with a unwitnessed seizure today. He reports he fell in shower. He did bite his time and feels sore all over which is typical for him after a seizure. He did have nonbloody, nonbilious vomiting afterwards which is also normal for him after a seizure. He was initially postictal with EMS but this is improved. He complains of feeling sore mostly in his arms but denies neck or back pain currently. No headache. Denies recent fevers, cough, dysuria or hematuria, rash. Denies numbness, tingling or focal weakness.   ROS: See HPI Constitutional: no fever  Eyes: no drainage  ENT: no runny nose   Cardiovascular:  no chest pain  Resp: no SOB  GI: no vomiting GU: no dysuria Integumentary: no rash  Allergy: no hives  Musculoskeletal: no leg swelling  Neurological: no slurred speech ROS otherwise negative  PAST MEDICAL HISTORY/PAST SURGICAL HISTORY:  Past Medical History  Diagnosis Date  . Seizures     MEDICATIONS:  Prior to Admission medications   Medication Sig Start Date End Date Taking? Authorizing Provider  acetaminophen (TYLENOL) 500 MG tablet Take 500 mg by mouth every 6 (six) hours as needed for mild pain or moderate pain.   Yes Historical Provider, MD  levETIRAcetam (KEPPRA) 500 MG tablet Take 1 tablet (500 mg total) by mouth every 12 (twelve) hours. 05/25/13  Yes Antony MaduraKelly Humes, PA-C  traMADol (ULTRAM) 50 MG tablet Take 50 mg by mouth every 6 (six) hours as needed for moderate pain. 05/11/13  Yes Tiffany Irine SealG Greene, PA-C    ALLERGIES:  Allergies  Allergen Reactions  . Bee Venom Swelling  . Tomato Swelling    SOCIAL HISTORY:  History  Substance Use Topics  . Smoking status: Current Every Day Smoker   Types: Cigarettes  . Smokeless tobacco: Not on file  . Alcohol Use: Yes    FAMILY HISTORY: History reviewed. No pertinent family history.  EXAM: BP 131/74 mmHg  Pulse 92  Temp(Src) 97.8 F (36.6 C) (Oral)  SpO2 95% CONSTITUTIONAL: Alert and oriented and responds appropriately to questions. Well-appearing; well-nourished; GCS 15 HEAD: Normocephalic; atraumatic EYES: Conjunctivae clear, PERRL, EOMI ENT: normal nose; no rhinorrhea; moist mucous membranes; pharynx without lesions noted; no dental injury; small abrasion to the tongue with no laceration that needs repair, no septal hematoma NECK: Supple, no meningismus, no LAD; no midline spinal tenderness, step-off or deformity CARD: RRR; S1 and S2 appreciated; no murmurs, no clicks, no rubs, no gallops RESP: Normal chest excursion without splinting or tachypnea; breath sounds clear and equal bilaterally; no wheezes, no rhonchi, no rales; chest wall stable, nontender to palpation ABD/GI: Normal bowel sounds; non-distended; soft, non-tender, no rebound, no guarding PELVIS:  stable, nontender to palpation BACK:  The back appears normal and is non-tender to palpation, there is no CVA tenderness; no midline spinal tenderness, step-off or deformity EXT: Normal ROM in all joints; non-tender to palpation; no edema; normal capillary refill; no cyanosis    SKIN: Normal color for age and race; warm NEURO: Moves all extremities equally; sensation to light touch intact diffusely, cranial nerves II through XII intact PSYCH: The patient's mood and manner are appropriate. Grooming and personal hygiene are appropriate.  MEDICAL DECISION  MAKING: Pt here with a seizure. He does have an abrasion to his tongue and feels sore all over and had vomiting which was typical of his prior seizures. He is now neurologically intact, oriented 3. He is requesting something for pain. We'll give Vicodin. We'll check basic labs. He has no infectious symptoms. Suspect his  seizure was likely secondary to being off Keppra for 3 weeks. We'll refill his Keppra and give him a coupon for this medication. Will give IV dose of Keppra in the emergency department.  ED PROGRESS: Labs unremarkable. Patient still feeling well. We'll discharge with prescription for Keppra and go for neurology follow-up information. Given return precautions. He verbalizes understanding and is comfortable with plan.      Layla MawKristen N Ward, DO 12/01/13 1450

## 2013-12-01 NOTE — Discharge Instructions (Signed)
Epilepsy °Epilepsy is a disorder in which a person has repeated seizures over time. A seizure is a release of abnormal electrical activity in the brain. Seizures can cause a change in attention, behavior, or the ability to remain awake and alert (altered mental status). Seizures often involve uncontrollable shaking (convulsions).  °Most people with epilepsy lead normal lives. However, people with epilepsy are at an increased risk of falls, accidents, and injuries. Therefore, it is important to begin treatment right away. °CAUSES  °Epilepsy has many possible causes. Anything that disturbs the normal pattern of brain cell activity can lead to seizures. This may include:  °· Head injury. °· Birth trauma. °· High fever as a child. °· Stroke. °· Bleeding into or around the brain. °· Certain drugs. °· Prolonged low oxygen, such as what occurs after CPR efforts. °· Abnormal brain development. °· Certain illnesses, such as meningitis, encephalitis (brain infection), malaria, and other infections. °· An imbalance of nerve signaling chemicals (neurotransmitters).   °SIGNS AND SYMPTOMS  °The symptoms of a seizure can vary greatly from one person to another. Right before a seizure, you may have a warning (aura) that a seizure is about to occur. An aura may include the following symptoms: °· Fear or anxiety. °· Nausea. °· Feeling like the room is spinning (vertigo). °· Vision changes, such as seeing flashing lights or spots. °Common symptoms during a seizure include: °· Abnormal sensations, such as an abnormal smell or a bitter taste in the mouth.   °· Sudden, general body stiffness.   °· Convulsions that involve rhythmic jerking of the face, arm, or leg on one or both sides.   °· Sudden change in consciousness.   °· Appearing to be awake but not responding.   °· Appearing to be asleep but cannot be awakened.   °· Grimacing, chewing, lip smacking, drooling, tongue biting, or loss of bowel or bladder control. °After a seizure,  you may feel sleepy for a while.  °DIAGNOSIS  °Your health care provider will ask about your symptoms and take a medical history. Descriptions from any witnesses to your seizures will be very helpful in the diagnosis. A physical exam, including a detailed neurological exam, is necessary. Various tests may be done, such as:  °· An electroencephalogram (EEG). This is a painless test of your brain waves. In this test, a diagram is created of your brain waves. These diagrams can be interpreted by a specialist. °· An MRI of the brain.   °· A CT scan of the brain.   °· A spinal tap (lumbar puncture, LP). °· Blood tests to check for signs of infection or abnormal blood chemistry. °TREATMENT  °There is no cure for epilepsy, but it is generally treatable. Once epilepsy is diagnosed, it is important to begin treatment as soon as possible. For most people with epilepsy, seizures can be controlled with medicines. The following may also be used: °· A pacemaker for the brain (vagus nerve stimulator) can be used for people with seizures that are not well controlled by medicine. °· Surgery on the brain. °For some people, epilepsy eventually goes away. °HOME CARE INSTRUCTIONS  °· Follow your health care provider's recommendations on driving and safety in normal activities. °· Get enough rest. Lack of sleep can cause seizures. °· Only take over-the-counter or prescription medicines as directed by your health care provider. Take any prescribed medicine exactly as directed. °· Avoid any known triggers of your seizures. °· Keep a seizure diary. Record what you recall about any seizure, especially any possible trigger.   °· Make   sure the people you live and work with know that you are prone to seizures. They should receive instructions on how to help you. In general, a witness to a seizure should:   °¨ Cushion your head and body.   °¨ Turn you on your side.   °¨ Avoid unnecessarily restraining you.   °¨ Not place anything inside your  mouth.   °¨ Call for emergency medical help if there is any question about what has occurred.   °· Follow up with your health care provider as directed. You may need regular blood tests to monitor the levels of your medicine.   °SEEK MEDICAL CARE IF:  °· You develop signs of infection or other illness. This might increase the risk of a seizure.   °· You seem to be having more frequent seizures.   °· Your seizure pattern is changing.   °SEEK IMMEDIATE MEDICAL CARE IF:  °· You have a seizure that does not stop after a few moments.   °· You have a seizure that causes any difficulty in breathing.   °· You have a seizure that results in a very severe headache.   °· You have a seizure that leaves you with the inability to speak or use a part of your body.   °Document Released: 01/03/2005 Document Revised: 10/24/2012 Document Reviewed: 08/15/2012 °ExitCare® Patient Information ©2015 [employer]. This information is not intended to replace advice given to you by your health care provider. Make sure you discuss any questions you have with your health care provider. ° °Driving and Equipment Restrictions °Some medical problems make it dangerous to drive, ride a bike, or use machines. Some of these problems are: °· A hard blow to the head (concussion). °· Passing out (fainting). °· Twitching and shaking (seizures). °· Low blood sugar. °· Taking medicine to help you relax (sedatives). °· Taking pain medicines. °· Wearing an eye patch. °· Wearing splints. This can make it hard to use parts of your body that you need to drive safely. °HOME CARE  °· Do not drive until your doctor says it is okay. °· Do not use machines until your doctor says it is okay. °You may need a form signed by your doctor (medical release) before you can drive again. You may also need this form before you do other tasks where you need to be fully alert. °MAKE SURE YOU: °· Understand these instructions. °· Will watch your condition. °· Will get help right  away if you are not doing well or get worse. °Document Released: 02/11/2004 Document Revised: 03/28/2011 Document Reviewed: 05/13/2009 °ExitCare® Patient Information ©2015 [employer]. This information is not intended to replace advice given to you by your health care provider. Make sure you discuss any questions you have with your health care provider. ° °

## 2013-12-01 NOTE — ED Notes (Signed)
Pt fell in bathroom while having a seizure at home.  Pt now alert and oriented.  NAD.  No confusion or delay.  Food given at pt's request.

## 2013-12-01 NOTE — ED Notes (Signed)
Bed: [id number] Expected date:  Expected time:  Means of arrival:  Comments: Seizure

## 2014-06-09 ENCOUNTER — Encounter (HOSPITAL_COMMUNITY): Payer: Self-pay | Admitting: Family Medicine

## 2014-06-09 ENCOUNTER — Emergency Department (HOSPITAL_COMMUNITY)
Admission: EM | Admit: 2014-06-09 | Discharge: 2014-06-10 | Disposition: A | Discharge status: Home / Self Care | Payer: No Typology Code available for payment source | Attending: Emergency Medicine | Admitting: Emergency Medicine

## 2014-06-09 DIAGNOSIS — G40909 Epilepsy, unspecified, not intractable, without status epilepticus: Secondary | ICD-10-CM | POA: Insufficient documentation

## 2014-06-09 DIAGNOSIS — Z9114 Patient's other noncompliance with medication regimen: Secondary | ICD-10-CM

## 2014-06-09 DIAGNOSIS — Z720 Tobacco use: Secondary | ICD-10-CM | POA: Insufficient documentation

## 2014-06-09 DIAGNOSIS — Z9119 Patient's noncompliance with other medical treatment and regimen: Secondary | ICD-10-CM | POA: Insufficient documentation

## 2014-06-09 NOTE — ED Notes (Signed)
Bed: [id number] Expected date: 06/09/14 Expected time: 11:11 PM Means of arrival: Ambulance Comments: seizure

## 2014-06-09 NOTE — ED Notes (Signed)
Patient states he has not took his Keppra in a couple of months due to not having medical insurance to pay for medications. Patient reports he does not remember the last time he had a seizure. Pt states he didn't feel different today or have any triggers that he was going to have a seizure.

## 2014-06-09 NOTE — ED Notes (Signed)
Per EMS, pt is from home and had two witnessed seizure by girlfriend while he was lying in bed. Patient's girlfriend witness the first seizure ;astomg for a 2 min. About 5 minutes later, patient had the second seizure, lasting 30 second. Pt was vomiting at residents home. Received ZOFRAN 4mg  IV and received 500mL of Normal Saline.

## 2014-06-10 MED ORDER — PHENYTOIN SODIUM EXTENDED 100 MG PO CAPS: 300.0000 mg | ORAL_CAPSULE | Freq: Every day | ORAL | Status: DC

## 2014-06-10 MED ORDER — ACETAMINOPHEN 500 MG PO TABS: 1000.0000 mg | ORAL_TABLET | Freq: Once | ORAL | Status: DC

## 2014-06-10 MED ORDER — SODIUM CHLORIDE 0.9 % IV SOLN
1000.0000 mg | Freq: Once | INTRAVENOUS | Status: AC
  Administered 2014-06-10: 1000 mg via INTRAVENOUS
  Filled 2014-06-10: qty 20

## 2014-06-10 MED ORDER — ACETAMINOPHEN 500 MG PO TABS
1000.0000 mg | ORAL_TABLET | Freq: Once | ORAL | Status: AC
  Administered 2014-06-10: 1000 mg via ORAL
  Filled 2014-06-10: qty 2

## 2014-06-10 NOTE — ED Provider Notes (Signed)
CSN: [id number]     Arrival date & time 06/09/14  2332 History   First MD Initiated Contact with Patient 06/10/14 0005     Chief Complaint  Patient presents with  . Seizures     (Consider location/radiation/quality/duration/timing/severity/associated sxs/prior Treatment) HPI  Patient presents after having had 2 witnessed seizures at home. Patient states he has had seizures for the past 2 years. He has no idea how often he has seizures. He states he has not taken any medication for his seizures because he cannot afford to buy them. He denies any injury from the seizures today.  PCP none  Past Medical History  Diagnosis Date  . Seizures    History reviewed. No pertinent past surgical history. History reviewed. No pertinent family history. History  Substance Use Topics  . Smoking status: Current Every Day Smoker    Types: Cigarettes  . Smokeless tobacco: Not on file  . Alcohol Use: Yes     Comment: "Once every weeks"  unemployed  Review of Systems  All other systems reviewed and are negative.     Allergies  Bee venom and Tomato  Home Medications   Prior to Admission medications   Medication Sig Start Date End Date Taking? Authorizing Provider  acetaminophen (TYLENOL) 500 MG tablet Take 1,000 mg by mouth every 6 (six) hours as needed for mild pain or moderate pain.    Yes Historical Provider, MD  levETIRAcetam (KEPPRA) 500 MG tablet Take 1 tablet (500 mg total) by mouth every 12 (twelve) hours. 05/25/13   Antony Madura, PA-C  levETIRAcetam (KEPPRA) 500 MG tablet Take 1 tablet (500 mg total) by mouth 2 (two) times daily. Patient not taking: Reported on 06/09/2014 12/01/13   Kristen N Ward, DO  phenytoin (DILANTIN) 100 MG ER capsule Take 3 capsules (300 mg total) by mouth at bedtime. 06/10/14   Devoria Albe, MD  traMADol (ULTRAM) 50 MG tablet Take 50 mg by mouth every 6 (six) hours as needed for moderate pain. 05/11/13   Tiffany Neva Seat, PA-C   BP 135/84 mmHg  Pulse 78  Temp(Src)  98.8 F (37.1 C) (Oral)  Resp 18  Ht  (1.88 m)  Wt 245 lb (111.131 kg)  BMI 31.44 kg/m2  SpO2 97%  Vital signs normal   Physical Exam  Constitutional: He is oriented to person, place, and time. He appears well-developed and well-nourished.  Non-toxic appearance. He does not appear ill. No distress.  HENT:  Head: Normocephalic and atraumatic.  Right Ear: External ear normal.  Left Ear: External ear normal.  Nose: Nose normal. No mucosal edema or rhinorrhea.  Mouth/Throat: Oropharynx is clear and moist and mucous membranes are normal. No dental abscesses or uvula swelling.  Questionable small bruise to the right side of the tongue  Eyes: Conjunctivae and EOM are normal. Pupils are equal, round, and reactive to light.  Neck: Normal range of motion and full passive range of motion without pain. Neck supple.  Cardiovascular: Normal rate, regular rhythm and normal heart sounds.  Exam reveals no gallop and no friction rub.   No murmur heard. Pulmonary/Chest: Effort normal and breath sounds normal. No respiratory distress. He has no wheezes. He has no rhonchi. He has no rales. He exhibits no tenderness and no crepitus.  Abdominal: Soft. Normal appearance and bowel sounds are normal. He exhibits no distension. There is no tenderness. There is no rebound and no guarding.  Musculoskeletal: Normal range of motion. He exhibits no edema or tenderness.  Moves all  extremities well.   Neurological: He is alert and oriented to person, place, and time. He has normal strength. No cranial nerve deficit.  Skin: Skin is warm, dry and intact. No rash noted. No erythema. No pallor.  Psychiatric: He has a normal mood and affect. His speech is normal and behavior is normal. His mood appears not anxious.  Nursing note and vitals reviewed.   ED Course  Procedures (including critical care time) Medications  acetaminophen (TYLENOL) tablet 1,000 mg (1,000 mg Oral Given 06/10/14 0332)  phenytoin (DILANTIN)  1,000 mg in sodium chloride 0.9 % 250 mL IVPB (1,000 mg Intravenous New Bag/Given 06/10/14 0347)   After patient was in ED*complaining of generalized toothache. He states he has some chronic teeth problems.  Patient states he's never been on Dilantin. Due to patient's monetary problems patient was started on Dilantin in the ED. Dilantin is a [employer] [facility] prescription.   Labs Review Labs Reviewed - No data to display  Imaging Review No results found.   EKG Interpretation   Date/Time:  Monday Jun 09 2014 23:38:19 EDT Ventricular Rate:  95 PR Interval:  147 QRS Duration: 90 QT Interval:  323 QTC Calculation: 406 R Axis:   63 Text Interpretation:  Sinus rhythm Electrode noise No significant change  since last tracing 27 May 2013 Confirmed by Kean Gautreau  MD-I, Woodson Macha ([id number]) on  06/10/2014 12:56:05 AM      MDM   Final diagnoses:  Seizure disorder  Noncompliance with medication regimen    New Prescriptions   PHENYTOIN (DILANTIN) 100 MG ER CAPSULE    Take 3 capsules (300 mg total) by mouth at bedtime.    Plan discharge  Devoria AlbeIva Minyon Billiter, MD, Concha PyoFACEP     Jullianna Gabor, MD 06/10/14 [id number]

## 2014-06-10 NOTE — Discharge Instructions (Signed)
Start the dilantin for your seizures, it is [facility]4 at [employer] [facility] [employer]. You need to get either a primary care doctor or a neurologist to follow you for your seizures. I gave you the number for a neurologist to call to get an appointment   Driving and Equipment Restrictions Some medical problems make it dangerous to drive, ride a bike, or use machines. Some of these problems are:  A hard blow to the head (concussion).  Passing out (fainting).  Twitching and shaking (seizures).  Low blood sugar.  Taking medicine to help you relax (sedatives).  Taking pain medicines.  Wearing an eye patch.  Wearing splints. This can make it hard to use parts of your body that you need to drive safely. HOME CARE   Do not drive until your doctor says it is okay.  Do not use machines until your doctor says it is okay. You may need a form signed by your doctor (medical release) before you can drive again. You may also need this form before you do other tasks where you need to be fully alert. MAKE SURE YOU:  Understand these instructions.  Will watch your condition.  Will get help right away if you are not doing well or get worse. Document Released: 02/11/2004 Document Revised: 03/28/2011 Document Reviewed: 05/13/2009 [facility] Patient Information 2015 [employer]. This information is not intended to replace advice given to you by your health care provider. Make sure you discuss any questions you have with your health care provider. .Marland Kitchen

## 2014-06-10 NOTE — ED Notes (Signed)
Pt taken ice and ginger ale.

## 2014-08-28 ENCOUNTER — Encounter (HOSPITAL_COMMUNITY): Payer: Self-pay | Admitting: Emergency Medicine

## 2014-08-28 ENCOUNTER — Observation Stay (HOSPITAL_COMMUNITY)
Admission: EM | Admit: 2014-08-28 | Discharge: 2014-08-29 | Disposition: A | Discharge status: Home / Self Care | Payer: No Typology Code available for payment source | Attending: Internal Medicine | Admitting: Internal Medicine

## 2014-08-28 ENCOUNTER — Emergency Department (HOSPITAL_COMMUNITY): Payer: No Typology Code available for payment source

## 2014-08-28 DIAGNOSIS — Z79899 Other long term (current) drug therapy: Secondary | ICD-10-CM | POA: Insufficient documentation

## 2014-08-28 DIAGNOSIS — I959 Hypotension, unspecified: Secondary | ICD-10-CM | POA: Insufficient documentation

## 2014-08-28 DIAGNOSIS — N179 Acute kidney failure, unspecified: Secondary | ICD-10-CM | POA: Insufficient documentation

## 2014-08-28 DIAGNOSIS — R569 Unspecified convulsions: Principal | ICD-10-CM

## 2014-08-28 DIAGNOSIS — F17210 Nicotine dependence, cigarettes, uncomplicated: Secondary | ICD-10-CM | POA: Insufficient documentation

## 2014-08-28 DIAGNOSIS — Z91018 Allergy to other foods: Secondary | ICD-10-CM | POA: Insufficient documentation

## 2014-08-28 DIAGNOSIS — Z9114 Patient's other noncompliance with medication regimen: Secondary | ICD-10-CM | POA: Insufficient documentation

## 2014-08-28 DIAGNOSIS — Z91030 Bee allergy status: Secondary | ICD-10-CM | POA: Insufficient documentation

## 2014-08-28 LAB — RAPID URINE DRUG SCREEN, HOSP PERFORMED
Amphetamines: NOT DETECTED
Barbiturates: NOT DETECTED
Benzodiazepines: NOT DETECTED
Cocaine: NOT DETECTED
Opiates: NOT DETECTED
Tetrahydrocannabinol: POSITIVE — AB

## 2014-08-28 LAB — CBC WITH DIFFERENTIAL/PLATELET
Basophils Absolute: 0 10*3/uL (ref 0.0–0.1)
Basophils Relative: 0 % (ref 0–1)
EOS ABS: 0.2 10*3/uL (ref 0.0–0.7)
Eosinophils Relative: 2 % (ref 0–5)
HCT: 47.4 % (ref 39.0–52.0)
HEMOGLOBIN: 16.6 g/dL (ref 13.0–17.0)
Lymphocytes Relative: 32 % (ref 12–46)
Lymphs Abs: 2.8 10*3/uL (ref 0.7–4.0)
MCH: 30.5 pg (ref 26.0–34.0)
MCHC: 35 g/dL (ref 30.0–36.0)
MCV: 87.1 fL (ref 78.0–100.0)
MONOS PCT: 6 % (ref 3–12)
Monocytes Absolute: 0.5 10*3/uL (ref 0.1–1.0)
Neutro Abs: 5 10*3/uL (ref 1.7–7.7)
Neutrophils Relative %: 60 % (ref 43–77)
PLATELETS: 253 10*3/uL (ref 150–400)
RBC: 5.44 MIL/uL (ref 4.22–5.81)
RDW: 12.2 % (ref 11.5–15.5)
WBC: 8.5 10*3/uL (ref 4.0–10.5)

## 2014-08-28 LAB — URINALYSIS, ROUTINE W REFLEX MICROSCOPIC
BILIRUBIN URINE: NEGATIVE
GLUCOSE, UA: NEGATIVE mg/dL
Hgb urine dipstick: NEGATIVE
Ketones, ur: NEGATIVE mg/dL
Leukocytes, UA: NEGATIVE
Nitrite: NEGATIVE
PH: 6.5 (ref 5.0–8.0)
Protein, ur: NEGATIVE mg/dL
Specific Gravity, Urine: 1.008 (ref 1.005–1.030)
Urobilinogen, UA: 0.2 mg/dL (ref 0.0–1.0)

## 2014-08-28 LAB — CBG MONITORING, ED: Glucose-Capillary: 79 mg/dL (ref 65–99)

## 2014-08-28 LAB — COMPREHENSIVE METABOLIC PANEL
ALK PHOS: 53 U/L (ref 38–126)
ALT: 21 U/L (ref 17–63)
AST: 32 U/L (ref 15–41)
Albumin: 3.3 g/dL — ABNORMAL LOW (ref 3.5–5.0)
Anion gap: 18 — ABNORMAL HIGH (ref 5–15)
BUN: 9 mg/dL (ref 6–20)
CALCIUM: 9.3 mg/dL (ref 8.9–10.3)
CO2: 20 mmol/L — ABNORMAL LOW (ref 22–32)
CREATININE: 1.84 mg/dL — AB (ref 0.61–1.24)
Chloride: 101 mmol/L (ref 101–111)
GFR calc Af Amer: 57 mL/min — ABNORMAL LOW (ref >60)
GFR calc non Af Amer: 49 mL/min — ABNORMAL LOW (ref >60)
Glucose, Bld: 108 mg/dL — ABNORMAL HIGH (ref 65–99)
Potassium: 3.8 mmol/L (ref 3.5–5.1)
Sodium: 139 mmol/L (ref 135–145)
TOTAL PROTEIN: 5.2 g/dL — AB (ref 6.5–8.1)
Total Bilirubin: 0.7 mg/dL (ref 0.3–1.2)

## 2014-08-28 LAB — I-STAT CG4 LACTIC ACID, ED: LACTIC ACID, VENOUS: 10.35 mmol/L — AB (ref 0.5–2.0)

## 2014-08-28 LAB — LIPASE, BLOOD: LIPASE: 21 U/L — AB (ref 22–51)

## 2014-08-28 MED ORDER — PHENYTOIN SODIUM 50 MG/ML IJ SOLN
2000.0000 mg | Freq: Once | INTRAMUSCULAR | Status: AC
  Administered 2014-08-28: 2000 mg via INTRAVENOUS
  Filled 2014-08-28: qty 40

## 2014-08-28 MED ORDER — ONDANSETRON HCL 4 MG/2ML IJ SOLN
4.0000 mg | Freq: Once | INTRAMUSCULAR | Status: AC
  Administered 2014-08-28: 4 mg via INTRAVENOUS
  Filled 2014-08-28: qty 2

## 2014-08-28 MED ORDER — SODIUM CHLORIDE 0.9 % IV BOLUS (SEPSIS)
2000.0000 mL | Freq: Once | INTRAVENOUS | Status: AC
Start: 2014-08-28 — End: 2014-08-28
  Administered 2014-08-28: 2000 mL via INTRAVENOUS

## 2014-08-28 NOTE — H&P (Signed)
[facility] History and Physical  [name]:[id number] DOB: 21-Sep-1990 DOA: 08/28/2014  Referring physician: Melene Plan, DO PCP: No PCP Per Patient   Chief Complaint: Seizure  HPI: Brandon Guerrero is a 24 y.o. male with prior history of seizures and non-compliance presents with a seizure episode. His girlfriend states that he hadd one seizure prior to coming into the ED. Patient has not been taking his mediations. Patient has also been drinking heavy according to his girlfriend. She states that he has been drinking regularly for at least a month or two. Patient was home alone when he had a seizure and he was brought in by his cousins. She does not think he had been doing any other drugs. Currently the patient is sleepy and post-ictal. His girl friend states that he reportedly threw up in the car after the seizure. This is the norm for him post seizure   Review of Systems:  Currently post-ictal and is not able to provide a ROS  Past Medical History  Diagnosis Date  . Seizures    History reviewed. No pertinent past surgical history. Social History:  reports that he has been smoking Cigarettes.  He does not have any smokeless tobacco history on file. He reports that he drinks alcohol. He reports that he does not use illicit drugs.  Allergies  Allergen Reactions  . Bee Venom Swelling  . Tomato Swelling    History reviewed. No pertinent family history.   Prior to Admission medications   Medication Sig Start Date End Date Taking? Authorizing Provider  acetaminophen (TYLENOL) 500 MG tablet Take 1,000 mg by mouth every 6 (six) hours as needed for mild pain or moderate pain.    Yes Historical Provider, MD  phenytoin (DILANTIN) 100 MG ER capsule Take 3 capsules (300 mg total) by mouth at bedtime. 06/10/14  Yes Devoria Albe, MD  levETIRAcetam (KEPPRA) 500 MG tablet Take 1 tablet (500 mg total) by mouth every 12 (twelve) hours. Patient not taking: Reported on 08/28/2014 05/25/13   Antony Madura, PA-C  levETIRAcetam (KEPPRA) 500 MG tablet Take 1 tablet (500 mg total) by mouth 2 (two) times daily. Patient not taking: Reported on 06/09/2014 12/01/13   Layla Maw Ward, DO   Physical Exam: Filed Vitals:   08/28/14 2130 08/28/14 2230 08/28/14 2325 08/28/14 2345  BP: 112/60 114/76 121/76 109/60  Pulse: 89 88 89 84  Temp:      Resp: 25 12 19 25   SpO2: 100% 97% 100% 98%    Wt Readings from Last 3 Encounters:  06/09/14 111.131 kg (245 lb)  05/25/13 94.887 kg (209 lb 3 oz)  05/11/13 95.482 kg (210 lb 8 oz)    General:  Appears somnolent Eyes: normal lids, irises & conjunctiva ENT: grossly normal lips & tongue Neck: no LAD, masses  Cardiovascular: RRR, no m/r/g. No LE edema Respiratory: CTA bilaterally, no w/r/r. Normal respiratory effort. Abdomen: soft, ntnd Skin: no rash or induration seen on limited exam Musculoskeletal: grossly normal tone BUE/BLE Psychiatric: unable to assess Neurologic: post-ictal.          Labs on Admission:  Basic Metabolic Panel:  Recent Labs Lab 08/28/14 2055  NA 139  K 3.8  CL 101  CO2 20*  GLUCOSE 108*  BUN 9  CREATININE 1.84*  CALCIUM 9.3   Liver Function Tests:  Recent Labs Lab 08/28/14 2055  AST 32  ALT 21  ALKPHOS 53  BILITOT 0.7  PROT 5.2*  ALBUMIN 3.3*  Recent Labs Lab 08/28/14 2055  LIPASE 21*   No results for input(s): AMMONIA in the last 168 hours. CBC:  Recent Labs Lab 08/28/14 2055  WBC 8.5  NEUTROABS 5.0  HGB 16.6  HCT 47.4  MCV 87.1  PLT 253   Cardiac Enzymes: No results for input(s): CKTOTAL, CKMB, CKMBINDEX, TROPONINI in the last 168 hours.  BNP (last 3 results) No results for input(s): BNP in the last 8760 hours.  ProBNP (last 3 results) No results for input(s): PROBNP in the last 8760 hours.  CBG:  Recent Labs Lab 08/28/14 2106  GLUCAP 79    Radiological Exams on Admission: Ct Head Wo Contrast  08/28/2014   CLINICAL DATA:  Seizure earlier today.  Confusion and lethargy.   EXAM: CT HEAD WITHOUT CONTRAST  TECHNIQUE: Contiguous axial images were obtained from the base of the skull through the vertex without intravenous contrast.  COMPARISON:  July 04, 2011  FINDINGS: The ventricles are normal in size and configuration. The right lateral ventricle is slightly larger than the left lateral ventricle, an anatomic variant. There is no intracranial mass, hemorrhage, extra-axial fluid collection, or midline shift. The gray-white compartments are normal. No acute infarct evident. Bony calvarium appears intact. The mastoid air cells are clear.  IMPRESSION: Study within normal limits.   Electronically Signed   By: Bretta Bang III M.D.   On: 08/28/2014 23:10   Dg Chest Port 1 View  08/28/2014   CLINICAL DATA:  24 year old male with seizure activity today. Nausea and vomiting. No associated fever, cough for congestion.  EXAM: PORTABLE CHEST - 1 VIEW  COMPARISON:  Chest x-ray 11/23/2012.  FINDINGS: Lung volumes are low. No consolidative airspace disease. No pleural effusions. No pneumothorax. No pulmonary nodule or mass noted. Pulmonary vasculature and the cardiomediastinal silhouette are within normal limits.  IMPRESSION: 1. Low lung volumes without radiographic evidence of acute cardiopulmonary disease.   Electronically Signed   By: Trudie Reed M.D.   On: 08/28/2014 21:23      Assessment/Plan Principal Problem:   Seizure Active Problems:   AKI (acute kidney injury)   Hypotensive episode   1. Seizure -patient has a history of non-compliance with medical therapy -currently post-ictal will observe overnight -pharmacy to assess AED  2. Hypotensive Episode -now he is normotensive for him based on prior pressures -will monitor pressures  3. AKI -likely due to dehydration -will continue with IV Fluids overnight   Code Status: Full Code (must indicate code status--if unknown or must be presumed, indicate so) DVT Prophylaxis:Heparin Family Communication: none  (indicate person spoken with, if applicable, with phone number if by telephone) Disposition Plan: home (indicate anticipated LOS)  Time spent:  [facility] Pager [phone]

## 2014-08-28 NOTE — ED Notes (Signed)
Per EMS - pt has been drinking alcohol today, was passenger in friend's car when he started having seizure-like activity, lasting 3-4 minutes.  Upon EMS arrival, pt was lethargic but responded to name.  Pt became combative en route, EMS called PD to help calm pt down.  Per EMS, pt vomited about 3L fluid.  EMS VS: 140 systolic, 120 HR, 123 CBG.  Pt has hx epilepsy, takes Keppra (did not take today). Pt A&O x 4, aware of what happened.  No urinary incontinence.

## 2014-08-28 NOTE — ED Provider Notes (Signed)
CSN: [id number]     Arrival date & time 08/28/14  2035 History   First MD Initiated Contact with Patient 08/28/14 2045     Chief Complaint  Patient presents with  . Seizures     (Consider location/radiation/quality/duration/timing/severity/associated sxs/prior Treatment) Patient is a 24 y.o. male presenting with seizures. The history is provided by the patient.  Seizures Seizure activity on arrival: no   Seizure type:  Grand mal Initial focality:  None Postictal symptoms: confusion   Return to baseline: no   Severity:  Severe Duration:  3 minutes Timing:  Once Progression:  Unchanged Recent head injury:  No recent head injuries PTA treatment:  None History of seizures: yes    24 yo M with a chief complaint of seizure. Per EMS patient had a 3-4 minute tonic-clonic activity. Upon arrival patient had very large amount of emesis. After which was found to be hypotensive. Was then brought here. Patient's initial blood pressure in the 70 systolic. Patient tired on initial exam.   Level V caveat altered mental status  Past Medical History  Diagnosis Date  . Seizures    History reviewed. No pertinent past surgical history. History reviewed. No pertinent family history. Social History  Substance Use Topics  . Smoking status: Current Every Day Smoker    Types: Cigarettes  . Smokeless tobacco: None  . Alcohol Use: Yes     Comment: "Once every weeks"    Review of Systems  Constitutional: Negative for fever and chills.  HENT: Negative for congestion and facial swelling.   Eyes: Negative for discharge and visual disturbance.  Respiratory: Negative for shortness of breath.   Cardiovascular: Negative for chest pain and palpitations.  Gastrointestinal: Positive for vomiting. Negative for abdominal pain and diarrhea.  Musculoskeletal: Negative for myalgias and arthralgias.  Skin: Negative for color change and rash.  Neurological: Positive for seizures. Negative for tremors, syncope  and headaches.  Psychiatric/Behavioral: Negative for confusion and dysphoric mood.      Allergies  Bee venom and Tomato  Home Medications   Prior to Admission medications   Medication Sig Start Date End Date Taking? Authorizing Provider  acetaminophen (TYLENOL) 500 MG tablet Take 1,000 mg by mouth every 6 (six) hours as needed for mild pain or moderate pain.    Yes Historical Provider, MD  phenytoin (DILANTIN) 100 MG ER capsule Take 3 capsules (300 mg total) by mouth at bedtime. 06/10/14  Yes Devoria Albe, MD  levETIRAcetam (KEPPRA) 500 MG tablet Take 1 tablet (500 mg total) by mouth every 12 (twelve) hours. Patient not taking: Reported on 08/28/2014 05/25/13   Antony Madura, PA-C  levETIRAcetam (KEPPRA) 500 MG tablet Take 1 tablet (500 mg total) by mouth 2 (two) times daily. Patient not taking: Reported on 06/09/2014 12/01/13   Kristen N Ward, DO   BP 109/60 mmHg  Pulse 84  Temp(Src) 98.8 F (37.1 C)  Resp 25  SpO2 98% Physical Exam  Constitutional: He is oriented to person, place, and time. He appears well-developed and well-nourished.  HENT:  Head: Normocephalic and atraumatic.  Eyes: EOM are normal. Pupils are equal, round, and reactive to light.  Neck: Normal range of motion. Neck supple. No JVD present.  Cardiovascular: Normal rate and regular rhythm.  Exam reveals no gallop and no friction rub.   No murmur heard. Pulmonary/Chest: No respiratory distress. He has no wheezes.  Abdominal: He exhibits no distension. There is no rebound and no guarding.  Musculoskeletal: Normal range of motion.  Neurological: He  is alert and oriented to person, place, and time. He displays no Babinski's sign on the right side. He displays no Babinski's sign on the left side.  Moving all 4 extremities. Confused in conversation.  Skin: No rash noted. No pallor.  Psychiatric: He has a normal mood and affect. His behavior is normal.    ED Course  Procedures (including critical care time) Labs  Review Labs Reviewed  COMPREHENSIVE METABOLIC PANEL - Abnormal; Notable for the following:    CO2 20 (*)    Glucose, Bld 108 (*)    Creatinine, Ser 1.84 (*)    Total Protein 5.2 (*)    Albumin 3.3 (*)    GFR calc non Af Amer 49 (*)    GFR calc Af Amer 57 (*)    Anion gap 18 (*)    All other components within normal limits  LIPASE, BLOOD - Abnormal; Notable for the following:    Lipase 21 (*)    All other components within normal limits  URINE RAPID DRUG SCREEN, HOSP PERFORMED - Abnormal; Notable for the following:    Tetrahydrocannabinol POSITIVE (*)    All other components within normal limits  I-STAT CG4 LACTIC ACID, ED - Abnormal; Notable for the following:    Lactic Acid, Venous 10.35 (*)    All other components within normal limits  CBC WITH DIFFERENTIAL/PLATELET  URINALYSIS, ROUTINE W REFLEX MICROSCOPIC (NOT AT [facility])  ETHANOL  CBG MONITORING, ED  I-STAT CG4 LACTIC ACID, ED  I-STAT CHEM 8, ED  I-STAT CG4 LACTIC ACID, ED    Imaging Review Ct Head Wo Contrast  08/28/2014   CLINICAL DATA:  Seizure earlier today.  Confusion and lethargy.  EXAM: CT HEAD WITHOUT CONTRAST  TECHNIQUE: Contiguous axial images were obtained from the base of the skull through the vertex without intravenous contrast.  COMPARISON:  July 04, 2011  FINDINGS: The ventricles are normal in size and configuration. The right lateral ventricle is slightly larger than the left lateral ventricle, an anatomic variant. There is no intracranial mass, hemorrhage, extra-axial fluid collection, or midline shift. The gray-white compartments are normal. No acute infarct evident. Bony calvarium appears intact. The mastoid air cells are clear.  IMPRESSION: Study within normal limits.   Electronically Signed   By: Bretta Bang III M.D.   On: 08/28/2014 23:10   Dg Chest Port 1 View  08/28/2014   CLINICAL DATA:  24 year old male with seizure activity today. Nausea and vomiting. No associated fever, cough for congestion.   EXAM: PORTABLE CHEST - 1 VIEW  COMPARISON:  Chest x-ray 11/23/2012.  FINDINGS: Lung volumes are low. No consolidative airspace disease. No pleural effusions. No pneumothorax. No pulmonary nodule or mass noted. Pulmonary vasculature and the cardiomediastinal silhouette are within normal limits.  IMPRESSION: 1. Low lung volumes without radiographic evidence of acute cardiopulmonary disease.   Electronically Signed   By: Trudie Reed M.D.   On: 08/28/2014 21:23   I, Rae Roam, personally reviewed and evaluated these images and lab results as part of my medical decision-making.   EKG Interpretation None      MDM   Final diagnoses:  Seizure  AKI (acute kidney injury)  Hypotensive episode    24 yo M with a chief complaint of seizure. Patient known noncompliant with his seizure medications at a 3-4 minute seizure while drinking. Found by EMS to be hypotensive. Given 2 L of fluid with improvement of blood pressure. Patient continues to be somnolent. Was observed in the ED  for about 4 hours with mild improvement. Initial lactic acid 10.3. Mild AKI. CT head unremarkable. Chest x-ray and urine without infection.  Patient continues to be confused. With initial hypotension will admit to the hospital.  The patients results and plan were reviewed and discussed.   Any x-rays performed were independently reviewed by myself.   Differential diagnosis were considered with the presenting HPI.  Medications  sodium chloride 0.9 % bolus 2,000 mL (0 mLs Intravenous Stopped 08/28/14 2243)  ondansetron (ZOFRAN) injection 4 mg (4 mg Intravenous Given 08/28/14 2101)  phenytoin (DILANTIN) 2,000 mg in sodium chloride 0.9 % 250 mL IVPB (0 mg Intravenous Stopped 08/28/14 2243)    Filed Vitals:   08/28/14 2130 08/28/14 2230 08/28/14 2325 08/28/14 2345  BP: 112/60 114/76 121/76 109/60  Pulse: 89 88 89 84  Temp:      Resp: SpO2: 100% 97% 100% 98%    Final diagnoses:  Seizure  AKI  (acute kidney injury)  Hypotensive episode    Admission/ observation were discussed with the admitting physician, patient and/or family and they are comfortable with the plan.    Melene Plan, DO 08/29/14 [id number]

## 2014-08-28 NOTE — ED Notes (Signed)
Pt CBG, 79. Nurse was notified.

## 2014-08-28 NOTE — ED Notes (Signed)
Patient returned from CT

## 2014-08-29 ENCOUNTER — Encounter (HOSPITAL_COMMUNITY): Payer: Self-pay | Admitting: *Deleted

## 2014-08-29 DIAGNOSIS — R569 Unspecified convulsions: Secondary | ICD-10-CM | POA: Diagnosis not present

## 2014-08-29 LAB — I-STAT CHEM 8, ED
BUN: 9 mg/dL (ref 6–20)
CHLORIDE: 106 mmol/L (ref 101–111)
CREATININE: 1.2 mg/dL (ref 0.61–1.24)
Calcium, Ion: 1.1 mmol/L — ABNORMAL LOW (ref 1.12–1.23)
Glucose, Bld: 95 mg/dL (ref 65–99)
HEMATOCRIT: 43 % (ref 39.0–52.0)
Hemoglobin: 14.6 g/dL (ref 13.0–17.0)
POTASSIUM: 4.4 mmol/L (ref 3.5–5.1)
Sodium: 141 mmol/L (ref 135–145)
TCO2: 25 mmol/L (ref 0–100)

## 2014-08-29 LAB — I-STAT CG4 LACTIC ACID, ED: Lactic Acid, Venous: 1.37 mmol/L (ref 0.5–2.0)

## 2014-08-29 LAB — RAPID URINE DRUG SCREEN, HOSP PERFORMED
AMPHETAMINES: NOT DETECTED
Barbiturates: NOT DETECTED
Benzodiazepines: NOT DETECTED
Cocaine: NOT DETECTED
OPIATES: NOT DETECTED
TETRAHYDROCANNABINOL: POSITIVE — AB

## 2014-08-29 LAB — CREATININE, SERUM
Creatinine, Ser: 1.34 mg/dL — ABNORMAL HIGH (ref 0.61–1.24)
GFR calc Af Amer: >60 mL/min (ref >60)

## 2014-08-29 LAB — GLUCOSE, CAPILLARY: Glucose-Capillary: 118 mg/dL — ABNORMAL HIGH (ref 65–99)

## 2014-08-29 LAB — I-STAT TROPONIN, ED: Troponin i, poc: 0.03 ng/mL (ref 0.00–0.08)

## 2014-08-29 LAB — PHENYTOIN LEVEL, TOTAL: Phenytoin Lvl: 17.8 ug/mL (ref 10.0–20.0)

## 2014-08-29 LAB — ETHANOL: Alcohol, Ethyl (B): <5 mg/dL (ref <5)

## 2014-08-29 LAB — PHENOBARBITAL LEVEL

## 2014-08-29 MED ORDER — THIAMINE HCL 100 MG/ML IJ SOLN
Freq: Once | INTRAVENOUS | Status: AC
  Administered 2014-08-29: 03:00:00 via INTRAVENOUS
  Filled 2014-08-29: qty 1000

## 2014-08-29 MED ORDER — THIAMINE HCL 100 MG/ML IJ SOLN: 100.0000 mg | Freq: Every day | INTRAMUSCULAR | Status: DC

## 2014-08-29 MED ORDER — VITAMIN B-1 100 MG PO TABS
100.0000 mg | ORAL_TABLET | Freq: Every day | ORAL | Status: DC
  Filled 2014-08-29: qty 1

## 2014-08-29 MED ORDER — ACETAMINOPHEN 325 MG PO TABS: 650.0000 mg | ORAL_TABLET | Freq: Four times a day (QID) | ORAL | Status: DC | PRN

## 2014-08-29 MED ORDER — FOLIC ACID 1 MG PO TABS
1.0000 mg | ORAL_TABLET | Freq: Every day | ORAL | Status: DC
  Administered 2014-08-29: 1 mg via ORAL
  Filled 2014-08-29: qty 1

## 2014-08-29 MED ORDER — SODIUM CHLORIDE 0.9 % IV SOLN
75.0000 mL/h | INTRAVENOUS | Status: DC
  Administered 2014-08-29: 75 mL/h via INTRAVENOUS

## 2014-08-29 MED ORDER — ACETAMINOPHEN 650 MG RE SUPP
650.0000 mg | Freq: Four times a day (QID) | RECTAL | Status: DC | PRN
Start: 2014-08-29 — End: 2014-08-29

## 2014-08-29 MED ORDER — ONDANSETRON HCL 4 MG/2ML IJ SOLN: 4.0000 mg | Freq: Four times a day (QID) | INTRAMUSCULAR | Status: DC | PRN

## 2014-08-29 MED ORDER — LORAZEPAM 2 MG/ML IJ SOLN: 1.0000 mg | INTRAMUSCULAR | Status: DC | PRN

## 2014-08-29 MED ORDER — HEPARIN SODIUM (PORCINE) 5000 UNIT/ML IJ SOLN
5000.0000 [IU] | Freq: Three times a day (TID) | INTRAMUSCULAR | Status: DC
  Administered 2014-08-29: 5000 [IU] via SUBCUTANEOUS
  Filled 2014-08-29: qty 1

## 2014-08-29 MED ORDER — SODIUM CHLORIDE 0.9 % IJ SOLN
3.0000 mL | Freq: Two times a day (BID) | INTRAMUSCULAR | Status: DC
  Administered 2014-08-29: 3 mL via INTRAVENOUS

## 2014-08-29 MED ORDER — FOLIC ACID 1 MG PO TABS: 1.0000 mg | ORAL_TABLET | Freq: Every day | ORAL | Status: DC

## 2014-08-29 MED ORDER — VITAMIN B-1 100 MG PO TABS
100.0000 mg | ORAL_TABLET | Freq: Every day | ORAL | Status: DC
  Administered 2014-08-29: 100 mg via ORAL

## 2014-08-29 MED ORDER — PHENYTOIN SODIUM EXTENDED 100 MG PO CAPS: 300.0000 mg | ORAL_CAPSULE | Freq: Every day | ORAL | Status: DC

## 2014-08-29 MED ORDER — ONDANSETRON HCL 4 MG PO TABS: 4.0000 mg | ORAL_TABLET | Freq: Four times a day (QID) | ORAL | Status: DC | PRN

## 2014-08-29 MED ORDER — PHENYTOIN SODIUM EXTENDED 100 MG PO CAPS
300.0000 mg | ORAL_CAPSULE | Freq: Every day | ORAL | Status: DC
  Administered 2014-08-29: 300 mg via ORAL
  Filled 2014-08-29: qty 3

## 2014-08-29 MED ORDER — ADULT MULTIVITAMIN W/MINERALS CH: 1.0000 | ORAL_TABLET | Freq: Every day | ORAL | Status: DC

## 2014-08-29 MED ORDER — ADULT MULTIVITAMIN W/MINERALS CH
1.0000 | ORAL_TABLET | Freq: Every day | ORAL | Status: DC
  Administered 2014-08-29: 1 via ORAL
  Filled 2014-08-29: qty 1

## 2014-08-29 NOTE — Progress Notes (Addendum)
MEDICATION RELATED CONSULT NOTE - INITIAL   Pharmacy Consult for Phenytoin  Indication: H/o seizures   Allergies  Allergen Reactions  . Bee Venom Swelling  . Tomato Swelling    Patient Measurements: Height: DL$  (190.5 cm) Weight: 242 lb 4.6 oz (109.9 kg) IBW/kg (Calculated) : 84.5 Adjusted Body Weight:   Vital Signs: Temp: 98.3 F (36.8 C) (08/12 0057) Temp Source: Oral (08/12 0057) BP: 108/62 mmHg (08/12 0057) Pulse Rate: 87 (08/12 0057) Intake/Output from previous day:   Intake/Output from this shift:    Labs:  Recent Labs  08/28/14 2055 08/29/14 0033  WBC 8.5  --   HGB 16.6 14.6  HCT 47.4 43.0  PLT 253  --   CREATININE 1.84* 1.20  ALBUMIN 3.3*  --   PROT 5.2*  --   AST 32  --   ALT 21  --   ALKPHOS 53  --   BILITOT 0.7  --    Estimated Creatinine Clearance: 126 mL/min (by C-G formula based on Cr of 1.2).   Microbiology: No results found for this or any previous visit (from the past 720 hour(s)).  Medical History: Past Medical History  Diagnosis Date  . Seizures     Medications:  Prescriptions prior to admission  Medication Sig Dispense Refill Last Dose  . acetaminophen (TYLENOL) 500 MG tablet Take 1,000 mg by mouth every 6 (six) hours as needed for mild pain or moderate pain.    Past Month at Unknown time  . phenytoin (DILANTIN) 100 MG ER capsule Take 3 capsules (300 mg total) by mouth at bedtime. 90 capsule 0 08/27/2014 at Unknown time  . levETIRAcetam (KEPPRA) 500 MG tablet Take 1 tablet (500 mg total) by mouth every 12 (twelve) hours. (Patient not taking: Reported on 08/28/2014) 60 tablet 0 Not Taking at Unknown time  . levETIRAcetam (KEPPRA) 500 MG tablet Take 1 tablet (500 mg total) by mouth 2 (two) times daily. (Patient not taking: Reported on 06/09/2014) 60 tablet 2 Not Taking at Unknown time    Assessment: 49 YOM with prior history of seizures and non-compliance with medications presents with a seizure episode. Patient has been drinking  heavily according to his girlfriend. Pharmacy consulted to manage phenytoin dosing. Phenytoin level adjusted for low albumin is mildly supra-therapeutic at 23.4. This likely could be since level was collected immediately after phenytoin dose was given. No s/s of toxicity observed.   Goal of Therapy:  Corrected phenytoin level: 10-20 mcg/mL   Plan:  -Continue phenytoin ER 300 mg daily at bedtime  -Monitor for s/s of seizures and toxicity   Vinnie Level, PharmD., BCPS Clinical Pharmacist Phone [phone]

## 2014-08-29 NOTE — Care Management (Signed)
This Case Manager received call from Elmer Bales, RN CM that hospital follow-up appointment needed for patient. Patient uninsured and unable to afford copays where he has recently had appointments.  Appointment obtained on 09/05/14 at 1400 with Scot Jun, PA. Appointment added to AVS.  Elmer Bales, RN CM updated.

## 2014-08-29 NOTE — ED Notes (Signed)
Transporting patient to new room assignment. 

## 2014-08-29 NOTE — Progress Notes (Signed)
Pt. Arrived via ED tech with girlfriend sleeping. VSS ad reporting no pain. Will continue to monitor. Suzy Bouchard E, RN 08/29/2014 1:09 AM

## 2014-08-29 NOTE — Progress Notes (Signed)
Discharge instruction reviewed with patient/family. RX given. Patient/family awaiting for food before leaving. All questions answered at this time. Transport home by family.  Sim Boast, RN

## 2014-08-29 NOTE — Progress Notes (Signed)
Attempted report, nurse states will call back. Melina Schools, [place] 08/29/2014 12:24 AM

## 2014-08-29 NOTE — Discharge Summary (Signed)
Physician Discharge Summary  Brandon Guerrero ZOX:[id number] DOB: Apr 10, 1990 DOA: 08/28/2014  PCP: No PCP Per Patient  Admit date: 08/28/2014 Discharge date: 08/29/2014  Recommendations for Outpatient Follow-up:  1.   Discharge Diagnoses:  Principal Problem:   Seizure Active Problems:   AKI (acute kidney injury)   Hypotensive episode   Discharge Condition: stable  Diet recommendation: general  Filed Weights   08/29/14 0057  Weight: 109.9 kg (242 lb 4.6 oz)    History of present illness:  24 y.o. male with prior history of seizures and non-compliance presents with a seizure episode. His girlfriend states that he hadd one seizure prior to coming into the ED. Patient has not been taking his mediations. Patient has also been drinking heavy according to his girlfriend. She states that he has been drinking regularly for at least a month or two. Patient was home alone when he had a seizure and he was brought in by his cousins. She does not think he had been doing any other drugs. Currently the patient is sleepy and post-ictal. His girl friend states that he reportedly threw up in the car after the seizure. This is the norm for him post seizure  Hospital Course:  Loaded dilantin. By discharge, mental status back to baseline. Care management consulted to assist with finding PCP.  No driving for 6 months  Procedures:  none  Consultations:  none  Discharge Exam: Filed Vitals:   08/29/14 1057  BP: 104/52  Pulse: 72  Temp: 99 F (37.2 C)  Resp: 18    General: a and o Cardiovascular: RRR Respiratory: CTA Neuro: nonfocal  Discharge Instructions   Discharge Instructions    Activity as tolerated - No restrictions    Complete by:  As directed      Diet general    Complete by:  As directed      Driving Restrictions    Complete by:  As directed   No driving for 6 months          Current Discharge Medication List    CONTINUE these medications which have CHANGED   Details   phenytoin (DILANTIN) 100 MG ER capsule Take 3 capsules (300 mg total) by mouth at bedtime. Qty: 90 capsule, Refills: 1      STOP taking these medications     acetaminophen (TYLENOL) 500 MG tablet      levETIRAcetam (KEPPRA) 500 MG tablet      levETIRAcetam (KEPPRA) 500 MG tablet        Allergies  Allergen Reactions  . Bee Venom Swelling  . Tomato Swelling      The results of significant diagnostics from this hospitalization (including imaging, microbiology, ancillary and laboratory) are listed below for reference.    Significant Diagnostic Studies: Ct Head Wo Contrast  08/28/2014   CLINICAL DATA:  Seizure earlier today.  Confusion and lethargy.  EXAM: CT HEAD WITHOUT CONTRAST  TECHNIQUE: Contiguous axial images were obtained from the base of the skull through the vertex without intravenous contrast.  COMPARISON:  July 04, 2011  FINDINGS: The ventricles are normal in size and configuration. The right lateral ventricle is slightly larger than the left lateral ventricle, an anatomic variant. There is no intracranial mass, hemorrhage, extra-axial fluid collection, or midline shift. The gray-white compartments are normal. No acute infarct evident. Bony calvarium appears intact. The mastoid air cells are clear.  IMPRESSION: Study within normal limits.   Electronically Signed   By: Bretta Bang III M.D.   On:  08/28/2014 23:10   Dg Chest Port 1 View  08/28/2014   CLINICAL DATA:  24 year old male with seizure activity today. Nausea and vomiting. No associated fever, cough for congestion.  EXAM: PORTABLE CHEST - 1 VIEW  COMPARISON:  Chest x-ray 11/23/2012.  FINDINGS: Lung volumes are low. No consolidative airspace disease. No pleural effusions. No pneumothorax. No pulmonary nodule or mass noted. Pulmonary vasculature and the cardiomediastinal silhouette are within normal limits.  IMPRESSION: 1. Low lung volumes without radiographic evidence of acute cardiopulmonary disease.   Electronically  Signed   By: Trudie Reed M.D.   On: 08/28/2014 21:23    Microbiology: No results found for this or any previous visit (from the past 240 hour(s)).   Labs: Basic Metabolic Panel:  Recent Labs Lab 08/28/14 2055 08/29/14 0033 08/29/14 0214  NA 139 141  --   K 3.8 4.4  --   CL 101 106  --   CO2 20*  --   --   GLUCOSE 108* 95  --   BUN 9 9  --   CREATININE 1.84* 1.20 1.34*  CALCIUM 9.3  --   --    Liver Function Tests:  Recent Labs Lab 08/28/14 2055  AST 32  ALT 21  ALKPHOS 53  BILITOT 0.7  PROT 5.2*  ALBUMIN 3.3*    Recent Labs Lab 08/28/14 2055  LIPASE 21*   No results for input(s): AMMONIA in the last 168 hours. CBC:  Recent Labs Lab 08/28/14 2055 08/29/14 0033  WBC 8.5  --   NEUTROABS 5.0  --   HGB 16.6 14.6  HCT 47.4 43.0  MCV 87.1  --   PLT 253  --    Cardiac Enzymes: No results for input(s): CKTOTAL, CKMB, CKMBINDEX, TROPONINI in the last 168 hours. BNP: BNP (last 3 results) No results for input(s): BNP in the last 8760 hours.  ProBNP (last 3 results) No results for input(s): PROBNP in the last 8760 hours.  CBG:  Recent Labs Lab 08/28/14 2106 08/29/14 0646  GLUCAP 79 118*       Signed:  Slevin Gunby L  [facility] 08/29/2014, 12:46 PM

## 2014-08-29 NOTE — Care Management Note (Signed)
Case Management Note  Patient Details  Name: Brandon Guerrero MRN: [id number] Date of Birth: 1991-01-15  Subjective/Objective:                    Action/Plan: Met with patient and girlfriend to discuss discharge needs. Patient is currently without a PCP. He has gone to the [facility] in the past, but states that he paid $70 for the visit and was only given a 10 day supply of his seizure medications.  Patient states that he IS ABLE to afford the medications, but was not able to afford additional clinic visits.  He states that if he was able to find a PCP that would provide him with more than a 10 day supply and a lower visit copay, he would be able to be more compliant.  Patient was given an appointment at the [facility] on 09/04/14 at 2pm. Patient was updated and is appreciative.  Bedside RN updated.  Expected Discharge Date:                  Expected Discharge Plan:  Home/Self Care  In-House Referral:  Financial Counselor  Discharge [facility]  Post Acute Care Choice:    Choice offered to:  Patient  DME Arranged:    DME Agency:     HH Arranged:    [place] Agency:     Status of Service:  Completed, signed off  Medicare Important Message Given:    Date Medicare IM Given:    Medicare IM give by:    Date Additional Medicare IM Given:    Additional Medicare Important Message give by:     If discussed at [place] of Stay Meetings, dates discussed:    Additional Comments:  Rolm Baptise, RN 08/29/2014, 2:52 PM

## 2014-09-05 ENCOUNTER — Inpatient Hospital Stay: Payer: No Typology Code available for payment source

## 2015-02-13 ENCOUNTER — Emergency Department (HOSPITAL_COMMUNITY)
Admission: EM | Admit: 2015-02-13 | Discharge: 2015-02-13 | Disposition: A | Discharge status: Home / Self Care | Payer: Medicare Other | Attending: Emergency Medicine | Admitting: Emergency Medicine

## 2015-02-13 ENCOUNTER — Emergency Department (HOSPITAL_COMMUNITY): Payer: Medicare Other

## 2015-02-13 ENCOUNTER — Encounter (HOSPITAL_COMMUNITY): Payer: Self-pay | Admitting: Nurse Practitioner

## 2015-02-13 DIAGNOSIS — F17210 Nicotine dependence, cigarettes, uncomplicated: Secondary | ICD-10-CM | POA: Diagnosis not present

## 2015-02-13 DIAGNOSIS — R569 Unspecified convulsions: Secondary | ICD-10-CM | POA: Diagnosis not present

## 2015-02-13 DIAGNOSIS — I959 Hypotension, unspecified: Secondary | ICD-10-CM | POA: Diagnosis not present

## 2015-02-13 DIAGNOSIS — R400 Somnolence: Secondary | ICD-10-CM | POA: Insufficient documentation

## 2015-02-13 DIAGNOSIS — Z79899 Other long term (current) drug therapy: Secondary | ICD-10-CM | POA: Insufficient documentation

## 2015-02-13 DIAGNOSIS — Z9119 Patient's noncompliance with other medical treatment and regimen: Secondary | ICD-10-CM | POA: Diagnosis not present

## 2015-02-13 LAB — BASIC METABOLIC PANEL
ANION GAP: 7 (ref 5–15)
BUN: 8 mg/dL (ref 6–20)
CHLORIDE: 107 mmol/L (ref 101–111)
CO2: 27 mmol/L (ref 22–32)
Calcium: 8.5 mg/dL — ABNORMAL LOW (ref 8.9–10.3)
Creatinine, Ser: 1.09 mg/dL (ref 0.61–1.24)
GFR calc Af Amer: >60 mL/min (ref >60)
GLUCOSE: 108 mg/dL — AB (ref 65–99)
POTASSIUM: 4.2 mmol/L (ref 3.5–5.1)
SODIUM: 141 mmol/L (ref 135–145)

## 2015-02-13 LAB — COMPREHENSIVE METABOLIC PANEL
ALT: 20 U/L (ref 17–63)
AST: 33 U/L (ref 15–41)
Albumin: 4.1 g/dL (ref 3.5–5.0)
Alkaline Phosphatase: 97 U/L (ref 38–126)
Anion gap: 25 — ABNORMAL HIGH (ref 5–15)
BUN: 11 mg/dL (ref 6–20)
CO2: 15 mmol/L — ABNORMAL LOW (ref 22–32)
CREATININE: 1.62 mg/dL — AB (ref 0.61–1.24)
Calcium: 9.6 mg/dL (ref 8.9–10.3)
Chloride: 100 mmol/L — ABNORMAL LOW (ref 101–111)
GFR, EST NON AFRICAN AMERICAN: 58 mL/min — AB (ref >60)
Glucose, Bld: 117 mg/dL — ABNORMAL HIGH (ref 65–99)
POTASSIUM: 4 mmol/L (ref 3.5–5.1)
Sodium: 140 mmol/L (ref 135–145)
TOTAL PROTEIN: 6.7 g/dL (ref 6.5–8.1)
Total Bilirubin: 0.4 mg/dL (ref 0.3–1.2)

## 2015-02-13 LAB — CBC WITH DIFFERENTIAL/PLATELET
Basophils Absolute: 0 10*3/uL (ref 0.0–0.1)
Basophils Relative: 0 %
EOS PCT: 1 %
Eosinophils Absolute: 0.1 10*3/uL (ref 0.0–0.7)
HCT: 43.7 % (ref 39.0–52.0)
Hemoglobin: 14.6 g/dL (ref 13.0–17.0)
LYMPHS ABS: 2.9 10*3/uL (ref 0.7–4.0)
LYMPHS PCT: 39 %
MCH: 29.9 pg (ref 26.0–34.0)
MCHC: 33.4 g/dL (ref 30.0–36.0)
MCV: 89.4 fL (ref 78.0–100.0)
Monocytes Absolute: 0.3 10*3/uL (ref 0.1–1.0)
Monocytes Relative: 4 %
Neutro Abs: 4.1 10*3/uL (ref 1.7–7.7)
Neutrophils Relative %: 56 %
Platelets: 252 10*3/uL (ref 150–400)
RBC: 4.89 MIL/uL (ref 4.22–5.81)
RDW: 12.5 % (ref 11.5–15.5)
WBC: 7.3 10*3/uL (ref 4.0–10.5)

## 2015-02-13 LAB — URINALYSIS, ROUTINE W REFLEX MICROSCOPIC
Bilirubin Urine: NEGATIVE
GLUCOSE, UA: NEGATIVE mg/dL
Hgb urine dipstick: NEGATIVE
Ketones, ur: NEGATIVE mg/dL
LEUKOCYTES UA: NEGATIVE
Nitrite: NEGATIVE
PROTEIN: NEGATIVE mg/dL
SPECIFIC GRAVITY, URINE: 1.013 (ref 1.005–1.030)
pH: 6 (ref 5.0–8.0)

## 2015-02-13 LAB — RAPID URINE DRUG SCREEN, HOSP PERFORMED
Amphetamines: NOT DETECTED
BARBITURATES: NOT DETECTED
BENZODIAZEPINES: NOT DETECTED
COCAINE: NOT DETECTED
Opiates: NOT DETECTED
Tetrahydrocannabinol: POSITIVE — AB

## 2015-02-13 LAB — CBG MONITORING, ED: Glucose-Capillary: 106 mg/dL — ABNORMAL HIGH (ref 65–99)

## 2015-02-13 LAB — SALICYLATE LEVEL: Salicylate Lvl: <4 mg/dL (ref 2.8–30.0)

## 2015-02-13 LAB — ETHANOL

## 2015-02-13 LAB — ACETAMINOPHEN LEVEL: Acetaminophen (Tylenol), Serum: <10 ug/mL — ABNORMAL LOW (ref 10–30)

## 2015-02-13 MED ORDER — SODIUM CHLORIDE 0.9 % IV BOLUS (SEPSIS)
1000.0000 mL | Freq: Once | INTRAVENOUS | Status: AC
  Administered 2015-02-13: 1000 mL via INTRAVENOUS

## 2015-02-13 MED ORDER — SODIUM CHLORIDE 0.9 % IV SOLN
1000.0000 mg | Freq: Once | INTRAVENOUS | Status: AC
  Administered 2015-02-13: 1000 mg via INTRAVENOUS
  Filled 2015-02-13 (×2): qty 20

## 2015-02-13 MED ORDER — PHENYTOIN SODIUM EXTENDED 100 MG PO CAPS: 300.0000 mg | ORAL_CAPSULE | Freq: Every day | ORAL | Status: DC

## 2015-02-13 NOTE — Progress Notes (Signed)
EDCM received phone call from EDP for medication assistance.  EDCM placed patient in [facility] program and faxed MATCH letter to [facility].  Dr. Silverio Lay reports he has received it.    Patient has seven days to fill prescription or MATCH letter will expire. This is a one time assist, patient will not be eligible for this assistance until this time next year Cost of this medication will be three dollars  No further [facility] needs at this time.

## 2015-02-13 NOTE — Discharge Instructions (Signed)
Take dilantin as prescribed.   See your doctor for refill.   Return to ER if you have another seizure, vomiting, headaches.

## 2015-02-13 NOTE — ED Notes (Signed)
Pt ambulated in the hall without difficulty
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
## 2015-02-13 NOTE — ED Provider Notes (Signed)
CSN: [id number]     Arrival date & time 02/13/15  1537 History   First MD Initiated Contact with Patient 02/13/15 1618     Chief Complaint  Patient presents with  . Seizures     (Consider location/radiation/quality/duration/timing/severity/associated sxs/prior Treatment) The history is provided by the patient.  Brandon Guerrero is a 25 y.o. male hx of seizure with medication uncompliance here with seizure. Patient was riding the car with a friend and per the friend, he had a seizure-like activity. Per EMS, he was stiffening up all over for about a minute and was postictal on arrival. Patient awake, exam and did not remember the incident. Patient states that he has not been taking his Dilantin for 2 weeks because he has not been able to get his Medicaid yet. He denies any alcohol use or any drug use. He does smoke cigarettes. He was admitted for seizures in August las year. Denies any fevers or chills and has been eating well.   Level V caveat- seizure, AMS     Past Medical History  Diagnosis Date  . Seizures (HCC)    History reviewed. No pertinent past surgical history. No family history on file. Social History  Substance Use Topics  . Smoking status: Current Every Day Smoker -- 12 years    Types: Cigarettes  . Smokeless tobacco: None  . Alcohol Use: Yes     Comment: "Once every weeks"    Review of Systems  Neurological: Positive for seizures.  All other systems reviewed and are negative.     Allergies  Bee venom and Tomato  Home Medications   Prior to Admission medications   Medication Sig Start Date End Date Taking? Authorizing Provider  phenytoin (DILANTIN) 100 MG ER capsule Take 3 capsules (300 mg total) by mouth at bedtime. Patient taking differently: Take 300 mg by mouth daily.  08/29/14  Yes Christiane Ha, MD   BP 112/67 mmHg  Pulse 92  Temp(Src) 98.4 F (36.9 C) (Oral)  Resp 26  Ht  (1.88 m)  Wt 242 lb (109.77 kg)  BMI 31.06 kg/m2  SpO2  99% Physical Exam  Constitutional: He is oriented to person, place, and time.  Tired, drowsy, but wakes up to exam   HENT:  Head: Normocephalic and atraumatic.  Mouth/Throat: Oropharynx is clear and moist.  Eyes: Conjunctivae are normal. Pupils are equal, round, and reactive to light.  Neck: Normal range of motion. Neck supple.  Cardiovascular: Normal rate, regular rhythm and normal heart sounds.   Pulmonary/Chest: Effort normal and breath sounds normal. No respiratory distress. He has no wheezes. He has no rales.  Abdominal: Soft. Bowel sounds are normal. He exhibits no distension. There is no tenderness. There is no rebound.  Musculoskeletal: Normal range of motion. He exhibits no edema or tenderness.  Neurological: He is alert and oriented to person, place, and time.  Skin: Skin is warm and dry.  Psychiatric: He has a normal mood and affect. His behavior is normal. Judgment and thought content normal.  Nursing note and vitals reviewed.   ED Course  Procedures (including critical care time) Labs Review Labs Reviewed  COMPREHENSIVE METABOLIC PANEL - Abnormal; Notable for the following:    Chloride 100 (*)    CO2 15 (*)    Glucose, Bld 117 (*)    Creatinine, Ser 1.62 (*)    GFR calc non Af Amer 58 (*)    Anion gap 25 (*)    All other components within  normal limits  ACETAMINOPHEN LEVEL - Abnormal; Notable for the following:    Acetaminophen (Tylenol), Serum <10 (*)    All other components within normal limits  URINE RAPID DRUG SCREEN, HOSP PERFORMED - Abnormal; Notable for the following:    Tetrahydrocannabinol POSITIVE (*)    All other components within normal limits  BASIC METABOLIC PANEL - Abnormal; Notable for the following:    Glucose, Bld 108 (*)    Calcium 8.5 (*)    All other components within normal limits  CBG MONITORING, ED - Abnormal; Notable for the following:    Glucose-Capillary 106 (*)    All other components within normal limits  CBC WITH  DIFFERENTIAL/PLATELET  ETHANOL  SALICYLATE LEVEL  URINALYSIS, ROUTINE W REFLEX MICROSCOPIC (NOT AT [facility])    Imaging Review Ct Head Wo Contrast  02/13/2015  CLINICAL DATA:  Seizure. Did not strike hit head. Had another seizure about 3 weeks ago. EXAM: CT HEAD WITHOUT CONTRAST TECHNIQUE: Contiguous axial images were obtained from the base of the skull through the vertex without intravenous contrast. COMPARISON:  08/28/2014 FINDINGS: Ventricles and sulci appear symmetrical. No mass effect or midline shift. No abnormal extra-axial fluid collections. Gray-white matter junctions are distinct. Basal cisterns are not effaced. No evidence of acute intracranial hemorrhage. No depressed skull fractures. Mild mucosal thickening in the maxillary antra. Tiny retention cyst in the left maxillary antrum. IMPRESSION: No acute intracranial abnormalities. Electronically Signed   By: Burman Nieves M.D.   On: 02/13/2015 18:16   I have personally reviewed and evaluated these images and lab results as part of my medical decision-making.   EKG Interpretation   Date/Time:  Friday February 13 2015 16:54:26 EST Ventricular Rate:  80 PR Interval:  143 QRS Duration: 97 QT Interval:  321 QTC Calculation: 370 R Axis:   52 Text Interpretation:  Sinus rhythm Borderline T abnormalities, diffuse  leads ST elevation, consider lateral injury No significant change since  last tracing Confirmed by YAO  MD, DAVID ([id number]) on 02/13/2015 5:05:53 PM      MDM   Final diagnoses:  None   Brandon Guerrero is a 25 y.o. male here with seizure. Patient also hypotensive in the 80s but now more awake and alert. I think hypotension may be because of recent seizure. Will check labs, CT head. Hasn't been taking dilantin for 2 weeks so will load with dilantin. Will likely need case management consult.   8:44 PM Patient ambulated, alert and awake. CT head nl. Labs showed low bicarb 15 initially with AG 25. After IVF, repeat chemistry nl. I  think low bicarb likely from seizure. Contacted case management, able to get him medication match for a month.    Richardean Canal, MD 02/13/15 2045

## 2016-02-05 ENCOUNTER — Emergency Department (HOSPITAL_COMMUNITY)
Admission: EM | Admit: 2016-02-05 | Discharge: 2016-02-05 | Disposition: A | Discharge status: Home / Self Care | Payer: Medicare Other | Attending: Physician Assistant | Admitting: Physician Assistant

## 2016-02-05 ENCOUNTER — Encounter (HOSPITAL_COMMUNITY): Payer: Self-pay | Admitting: *Deleted

## 2016-02-05 DIAGNOSIS — Z760 Encounter for issue of repeat prescription: Secondary | ICD-10-CM | POA: Insufficient documentation

## 2016-02-05 DIAGNOSIS — F17210 Nicotine dependence, cigarettes, uncomplicated: Secondary | ICD-10-CM | POA: Insufficient documentation

## 2016-02-05 MED ORDER — DIVALPROEX SODIUM 500 MG PO DR TAB: 500.0000 mg | DELAYED_RELEASE_TABLET | Freq: Two times a day (BID) | ORAL | 0 refills | Status: DC

## 2016-02-05 NOTE — ED Triage Notes (Signed)
Pt states he needs a refill for his epilepsy medication.  States his pcp is out of town, and the new pcp only gave him 1 month which is d/t run out in 3 days. No other complaints.

## 2016-02-05 NOTE — ED Provider Notes (Signed)
[facility]-EMERGENCY DEPT Provider Note    By signing my name below, I, Earmon Phoenix, attest that this documentation has been prepared under the direction and in the presence of Fayrene Helper, PA-C. Electronically Signed: Earmon Phoenix, ED Scribe. 02/05/16. 3:55 PM.    History   Chief Complaint Chief Complaint  Patient presents with  . Medication Refill    The history is provided by the patient and medical records. No language interpreter was used.    Brandon Guerrero is a 26 y.o. male with PMHx of epileptic seizures who presents to the Emergency Department needing a refill of his Depakote 500 mg tablets that he takes BID. He states his PCP is at the [facility] but he cannot get into see them because he cannot pay the copay stating they told him his Medicaid/Medicare coverage had lapsed. He states he has one more day's worth of his medication. There are no modifying factors noted. He denies CP, SOB, appetite or sleep changes. He states it has been 3-4 months since his last seizure.   Past Medical History:  Diagnosis Date  . Seizures [facility])     Patient Active Problem List   Diagnosis Date Noted  . Seizure (HCC) 08/28/2014  . AKI (acute kidney injury) (HCC) 08/28/2014  . Hypotensive episode 08/28/2014    History reviewed. No pertinent surgical history.     Home Medications    Prior to Admission medications   Medication Sig Start Date End Date Taking? Authorizing Provider  phenytoin (DILANTIN) 100 MG ER capsule Take 3 capsules (300 mg total) by mouth at bedtime. 02/13/15   Charlynne Pander, MD    Family History No family history on file.  Social History Social History  Substance Use Topics  . Smoking status: Current Every Day Smoker    Years: 12.00    Types: Cigarettes  . Smokeless tobacco: Never Used  . Alcohol use Yes     Comment: "Once every weeks"     Allergies   Bee venom and Tomato   Review of Systems Review of Systems  Constitutional: Negative  for appetite change.  Respiratory: Negative for shortness of breath.   Cardiovascular: Negative for chest pain.  Psychiatric/Behavioral: Negative for sleep disturbance.     Physical Exam Updated Vital Signs BP 111/71   Pulse 74   Temp 98.5 F (36.9 C)   Resp 20   Ht 6\' 2"  (1.88 m)   Wt 252 lb (114.3 kg)   SpO2 99%   BMI 32.35 kg/m   Physical Exam  Constitutional: He is oriented to person, place, and time. He appears well-developed and well-nourished.  HENT:  Head: Normocephalic and atraumatic.  Neck: Normal range of motion.  Cardiovascular: Normal rate, regular rhythm and normal heart sounds.  Exam reveals no gallop and no friction rub.   No murmur heard. Pulmonary/Chest: Effort normal and breath sounds normal. No respiratory distress. He has no wheezes. He has no rales.  Musculoskeletal: Normal range of motion.  Neurological: He is alert and oriented to person, place, and time.  GCS 15.  Skin: Skin is warm and dry.  Psychiatric: He has a normal mood and affect. His behavior is normal.  Nursing note and vitals reviewed.    ED Treatments / Results  DIAGNOSTIC STUDIES: Oxygen Saturation is 99% on RA, normal by my interpretation.   COORDINATION OF CARE: 3:54 PM- Will refill two weeks worth of medication. Advised pt to follow up with PCP prior to running out of this  prescription. Explained to pt that only a refill for a short time could be given. Pt verbalizes understanding and agrees to plan.  Medications - No data to display  Labs (all labs ordered are listed, but only abnormal results are displayed) Labs Reviewed - No data to display  EKG  EKG Interpretation None       Radiology No results found.  Procedures Procedures (including critical care time)  Medications Ordered in ED Medications - No data to display   Initial Impression / Assessment and Plan / ED Course  I have reviewed the triage vital signs and the nursing notes.  Pertinent labs &  imaging results that were available during my care of the patient were reviewed by me and considered in my medical decision making (see chart for details).     Pt here for refill of medication of Depakote 500 mg. Medication is not a controlled substance. Will refill medication here. Discussed need to follow up with PCP in 2-3 days. Pt is safe for discharge at this time.  I personally performed the services described in this documentation, which was scribed in my presence. The recorded information has been reviewed and is accurate.     Final Clinical Impressions(s) / ED Diagnoses   Final diagnoses:  Encounter for medication refill    New Prescriptions New Prescriptions   DIVALPROEX (DEPAKOTE) 500 MG DR TABLET    Take 1 tablet (500 mg total) by mouth 2 (two) times daily.     Fayrene HelperBowie Mikele Sifuentes, PA-C 02/05/16 1609    Courteney Lyn Mackuen, MD 02/07/16 1324

## 2016-02-05 NOTE — ED Notes (Signed)
Pt requesting 500 mg Depakote for seizures, pt states, "[facility] but won't because I can't pay my co pay because they say my medicare is inactive." last took Depakote today, pt denies symptoms at this time

## 2016-03-04 ENCOUNTER — Emergency Department (HOSPITAL_COMMUNITY): Payer: Medicare Other

## 2016-03-04 ENCOUNTER — Encounter (HOSPITAL_COMMUNITY): Payer: Self-pay | Admitting: *Deleted

## 2016-03-04 ENCOUNTER — Emergency Department (HOSPITAL_COMMUNITY)
Admission: EM | Admit: 2016-03-04 | Discharge: 2016-03-04 | Disposition: A | Discharge status: Home / Self Care | Payer: Medicare Other | Attending: Emergency Medicine | Admitting: Emergency Medicine

## 2016-03-04 DIAGNOSIS — Y92410 Unspecified street and highway as the place of occurrence of the external cause: Secondary | ICD-10-CM | POA: Insufficient documentation

## 2016-03-04 DIAGNOSIS — Z87891 Personal history of nicotine dependence: Secondary | ICD-10-CM | POA: Diagnosis not present

## 2016-03-04 DIAGNOSIS — S161XXA Strain of muscle, fascia and tendon at neck level, initial encounter: Secondary | ICD-10-CM | POA: Insufficient documentation

## 2016-03-04 DIAGNOSIS — S90414A Abrasion, right lesser toe(s), initial encounter: Secondary | ICD-10-CM | POA: Insufficient documentation

## 2016-03-04 DIAGNOSIS — S199XXA Unspecified injury of neck, initial encounter: Secondary | ICD-10-CM | POA: Diagnosis present

## 2016-03-04 DIAGNOSIS — Y9389 Activity, other specified: Secondary | ICD-10-CM | POA: Diagnosis not present

## 2016-03-04 DIAGNOSIS — Y999 Unspecified external cause status: Secondary | ICD-10-CM | POA: Insufficient documentation

## 2016-03-04 HISTORY — DX: Epilepsy, unspecified, not intractable, without status epilepticus: G40.909

## 2016-03-04 MED ORDER — CYCLOBENZAPRINE HCL 10 MG PO TABS: 10.0000 mg | ORAL_TABLET | Freq: Three times a day (TID) | ORAL | 0 refills | Status: DC | PRN

## 2016-03-04 MED ORDER — IBUPROFEN 800 MG PO TABS: 800.0000 mg | ORAL_TABLET | Freq: Three times a day (TID) | ORAL | 0 refills | Status: DC | PRN

## 2016-03-04 NOTE — Discharge Instructions (Signed)
Your CT scan showed a nodule on your thyroid - please follow up with your family doctor for further studies.
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
## 2016-03-13 ENCOUNTER — Observation Stay (HOSPITAL_COMMUNITY): Payer: Medicare Other | Admitting: Anesthesiology

## 2016-03-13 ENCOUNTER — Encounter (HOSPITAL_COMMUNITY): Payer: Self-pay | Admitting: *Deleted

## 2016-03-13 ENCOUNTER — Emergency Department (HOSPITAL_COMMUNITY): Payer: Medicare Other

## 2016-03-13 ENCOUNTER — Encounter (HOSPITAL_COMMUNITY)
Admission: EM | Disposition: A | Discharge status: Home / Self Care | Payer: Self-pay | Source: Home / Self Care | Attending: Emergency Medicine

## 2016-03-13 ENCOUNTER — Encounter: Payer: Self-pay | Admitting: Pulmonary Disease

## 2016-03-13 ENCOUNTER — Observation Stay (HOSPITAL_COMMUNITY)
Admission: EM | Admit: 2016-03-13 | Discharge: 2016-03-13 | Disposition: A | Discharge status: Home / Self Care | Payer: Medicare Other | Attending: Pulmonary Disease | Admitting: Pulmonary Disease

## 2016-03-13 DIAGNOSIS — Z91018 Allergy to other foods: Secondary | ICD-10-CM | POA: Diagnosis not present

## 2016-03-13 DIAGNOSIS — G40909 Epilepsy, unspecified, not intractable, without status epilepticus: Secondary | ICD-10-CM | POA: Diagnosis not present

## 2016-03-13 DIAGNOSIS — K122 Cellulitis and abscess of mouth: Principal | ICD-10-CM | POA: Insufficient documentation

## 2016-03-13 DIAGNOSIS — K047 Periapical abscess without sinus: Secondary | ICD-10-CM

## 2016-03-13 DIAGNOSIS — L0211 Cutaneous abscess of neck: Secondary | ICD-10-CM | POA: Diagnosis not present

## 2016-03-13 DIAGNOSIS — R1310 Dysphagia, unspecified: Secondary | ICD-10-CM | POA: Diagnosis not present

## 2016-03-13 DIAGNOSIS — Z91030 Bee allergy status: Secondary | ICD-10-CM | POA: Diagnosis not present

## 2016-03-13 DIAGNOSIS — R590 Localized enlarged lymph nodes: Secondary | ICD-10-CM | POA: Insufficient documentation

## 2016-03-13 DIAGNOSIS — R509 Fever, unspecified: Secondary | ICD-10-CM

## 2016-03-13 DIAGNOSIS — Z87891 Personal history of nicotine dependence: Secondary | ICD-10-CM | POA: Insufficient documentation

## 2016-03-13 DIAGNOSIS — Z794 Long term (current) use of insulin: Secondary | ICD-10-CM | POA: Diagnosis not present

## 2016-03-13 DIAGNOSIS — Z98818 Other dental procedure status: Secondary | ICD-10-CM

## 2016-03-13 HISTORY — PX: DEBRIDEMENT MANDIBLE: SHX5308

## 2016-03-13 LAB — CBC WITH DIFFERENTIAL/PLATELET
Basophils Absolute: 0 10*3/uL (ref 0.0–0.1)
Basophils Relative: 0 %
Eosinophils Absolute: 0 10*3/uL (ref 0.0–0.7)
Eosinophils Relative: 0 %
HCT: 37.7 % — ABNORMAL LOW (ref 39.0–52.0)
Hemoglobin: 12.8 g/dL — ABNORMAL LOW (ref 13.0–17.0)
Lymphocytes Relative: 5 %
Lymphs Abs: 1.1 10*3/uL (ref 0.7–4.0)
MCH: 29.8 pg (ref 26.0–34.0)
MCHC: 34 g/dL (ref 30.0–36.0)
MCV: 87.9 fL (ref 78.0–100.0)
Monocytes Absolute: 2.3 10*3/uL — ABNORMAL HIGH (ref 0.1–1.0)
Monocytes Relative: 11 %
Neutro Abs: 18.6 10*3/uL — ABNORMAL HIGH (ref 1.7–7.7)
Neutrophils Relative %: 84 %
Platelets: 213 10*3/uL (ref 150–400)
RBC: 4.29 MIL/uL (ref 4.22–5.81)
RDW: 12.9 % (ref 11.5–15.5)
WBC: 22 10*3/uL — ABNORMAL HIGH (ref 4.0–10.5)

## 2016-03-13 LAB — GLUCOSE, CAPILLARY: Glucose-Capillary: 130 mg/dL — ABNORMAL HIGH (ref 65–99)

## 2016-03-13 LAB — COMPREHENSIVE METABOLIC PANEL WITH GFR
ALT: 13 U/L — ABNORMAL LOW (ref 17–63)
AST: 20 U/L (ref 15–41)
Albumin: 3.4 g/dL — ABNORMAL LOW (ref 3.5–5.0)
Alkaline Phosphatase: 55 U/L (ref 38–126)
Anion gap: 12 (ref 5–15)
BUN: 7 mg/dL (ref 6–20)
CO2: 26 mmol/L (ref 22–32)
Calcium: 9.4 mg/dL (ref 8.9–10.3)
Chloride: 102 mmol/L (ref 101–111)
Creatinine, Ser: 1.13 mg/dL (ref 0.61–1.24)
GFR calc Af Amer: >60 mL/min
GFR calc non Af Amer: >60 mL/min
Glucose, Bld: 104 mg/dL — ABNORMAL HIGH (ref 65–99)
Potassium: 3.7 mmol/L (ref 3.5–5.1)
Sodium: 140 mmol/L (ref 135–145)
Total Bilirubin: 1 mg/dL (ref 0.3–1.2)
Total Protein: 7.3 g/dL (ref 6.5–8.1)

## 2016-03-13 LAB — I-STAT CHEM 8, ED
BUN: 7 mg/dL (ref 6–20)
CALCIUM ION: 1.09 mmol/L — AB (ref 1.15–1.40)
CHLORIDE: 104 mmol/L (ref 101–111)
Creatinine, Ser: 1.1 mg/dL (ref 0.61–1.24)
Glucose, Bld: 105 mg/dL — ABNORMAL HIGH (ref 65–99)
HCT: 38 % — ABNORMAL LOW (ref 39.0–52.0)
Hemoglobin: 12.9 g/dL — ABNORMAL LOW (ref 13.0–17.0)
Potassium: 3.6 mmol/L (ref 3.5–5.1)
SODIUM: 139 mmol/L (ref 135–145)
TCO2: 25 mmol/L (ref 0–100)

## 2016-03-13 LAB — MRSA PCR SCREENING: MRSA BY PCR: NEGATIVE

## 2016-03-13 LAB — I-STAT CG4 LACTIC ACID, ED: Lactic Acid, Venous: 0.78 mmol/L (ref 0.5–1.9)

## 2016-03-13 SURGERY — DEBRIDEMENT, MANDIBLE
Anesthesia: General | Laterality: Bilateral

## 2016-03-13 MED ORDER — SUFENTANIL CITRATE 50 MCG/ML IV SOLN
INTRAVENOUS | Status: AC
  Filled 2016-03-13: qty 1

## 2016-03-13 MED ORDER — DEXAMETHASONE SODIUM PHOSPHATE 10 MG/ML IJ SOLN
5.0000 mg | Freq: Four times a day (QID) | INTRAMUSCULAR | Status: DC
  Administered 2016-03-13: 5 mg via INTRAVENOUS
  Filled 2016-03-13: qty 0.5

## 2016-03-13 MED ORDER — SODIUM CHLORIDE 0.9 % IV BOLUS (SEPSIS): 1000.0000 mL | Freq: Once | INTRAVENOUS | Status: DC

## 2016-03-13 MED ORDER — ACETAMINOPHEN 325 MG PO TABS: 650.0000 mg | ORAL_TABLET | Freq: Four times a day (QID) | ORAL | Status: DC | PRN

## 2016-03-13 MED ORDER — ONDANSETRON HCL 4 MG/2ML IJ SOLN
INTRAMUSCULAR | Status: DC | PRN
  Administered 2016-03-13: 4 mg via INTRAVENOUS

## 2016-03-13 MED ORDER — DEXAMETHASONE SODIUM PHOSPHATE 10 MG/ML IJ SOLN
10.0000 mg | Freq: Once | INTRAMUSCULAR | Status: AC
  Administered 2016-03-13: 10 mg via INTRAVENOUS
  Filled 2016-03-13: qty 1

## 2016-03-13 MED ORDER — SODIUM CHLORIDE 0.9 % IJ SOLN
INTRAMUSCULAR | Status: AC
  Filled 2016-03-13: qty 10

## 2016-03-13 MED ORDER — LIDOCAINE-EPINEPHRINE (PF) 1 %-1:200000 IJ SOLN
INTRAMUSCULAR | Status: AC
  Filled 2016-03-13: qty 30

## 2016-03-13 MED ORDER — MORPHINE SULFATE (PF) 4 MG/ML IV SOLN
4.0000 mg | Freq: Once | INTRAVENOUS | Status: AC
  Administered 2016-03-13: 4 mg via INTRAVENOUS
  Filled 2016-03-13: qty 1

## 2016-03-13 MED ORDER — LACTATED RINGERS IV SOLN
INTRAVENOUS | Status: DC
  Administered 2016-03-13: 13:00:00 via INTRAVENOUS

## 2016-03-13 MED ORDER — SODIUM CHLORIDE 0.9 % IV BOLUS (SEPSIS)
1000.0000 mL | Freq: Once | INTRAVENOUS | Status: AC
  Administered 2016-03-13: 1000 mL via INTRAVENOUS

## 2016-03-13 MED ORDER — PIPERACILLIN-TAZOBACTAM 3.375 G IVPB 30 MIN
3.3750 g | Freq: Once | INTRAVENOUS | Status: AC
  Administered 2016-03-13: 3.375 g via INTRAVENOUS
  Filled 2016-03-13: qty 50

## 2016-03-13 MED ORDER — ROCURONIUM BROMIDE 50 MG/5ML IV SOSY
PREFILLED_SYRINGE | INTRAVENOUS | Status: AC
  Filled 2016-03-13: qty 5

## 2016-03-13 MED ORDER — LIDOCAINE HCL (CARDIAC) 20 MG/ML IV SOLN
INTRAVENOUS | Status: DC | PRN
  Administered 2016-03-13: 100 mg via INTRATRACHEAL

## 2016-03-13 MED ORDER — SODIUM CHLORIDE 0.9 % IV SOLN
3.0000 g | Freq: Three times a day (TID) | INTRAVENOUS | Status: DC
  Administered 2016-03-13: 3 g via INTRAVENOUS
  Filled 2016-03-13 (×2): qty 3

## 2016-03-13 MED ORDER — AMOXICILLIN-POT CLAVULANATE 875-125 MG PO TABS: 1.0000 | ORAL_TABLET | Freq: Two times a day (BID) | ORAL | 0 refills | Status: AC

## 2016-03-13 MED ORDER — DEXAMETHASONE SODIUM PHOSPHATE 10 MG/ML IJ SOLN
5.0000 mg | Freq: Four times a day (QID) | INTRAMUSCULAR | Status: DC
  Filled 2016-03-13 (×3): qty 0.5

## 2016-03-13 MED ORDER — ONDANSETRON HCL 4 MG/2ML IJ SOLN: 4.0000 mg | Freq: Four times a day (QID) | INTRAMUSCULAR | Status: DC | PRN

## 2016-03-13 MED ORDER — OXYCODONE-ACETAMINOPHEN 5-325 MG PO TABS: 1.0000 | ORAL_TABLET | ORAL | Status: DC | PRN

## 2016-03-13 MED ORDER — VALPROATE SODIUM 500 MG/5ML IV SOLN
500.0000 mg | Freq: Two times a day (BID) | INTRAVENOUS | Status: DC
  Administered 2016-03-13: 500 mg via INTRAVENOUS
  Filled 2016-03-13 (×2): qty 5

## 2016-03-13 MED ORDER — INSULIN ASPART 100 UNIT/ML ~~LOC~~ SOLN
0.0000 [IU] | SUBCUTANEOUS | Status: DC
  Administered 2016-03-13: 2 [IU] via SUBCUTANEOUS

## 2016-03-13 MED ORDER — SUCCINYLCHOLINE CHLORIDE 200 MG/10ML IV SOSY
PREFILLED_SYRINGE | INTRAVENOUS | Status: AC
  Filled 2016-03-13: qty 10

## 2016-03-13 MED ORDER — MIDAZOLAM HCL 2 MG/2ML IJ SOLN
INTRAMUSCULAR | Status: DC | PRN
  Administered 2016-03-13: 2 mg via INTRAVENOUS

## 2016-03-13 MED ORDER — PROPOFOL 10 MG/ML IV BOLUS
INTRAVENOUS | Status: AC
Start: 2016-03-13 — End: 2016-03-13
  Filled 2016-03-13: qty 20

## 2016-03-13 MED ORDER — SODIUM CHLORIDE 0.9 % IV SOLN: 250.0000 mL | INTRAVENOUS | Status: DC | PRN

## 2016-03-13 MED ORDER — ONDANSETRON HCL 4 MG/2ML IJ SOLN
INTRAMUSCULAR | Status: AC
  Filled 2016-03-13: qty 2

## 2016-03-13 MED ORDER — PROPOFOL 10 MG/ML IV BOLUS
INTRAVENOUS | Status: DC | PRN
  Administered 2016-03-13: 120 mg via INTRAVENOUS

## 2016-03-13 MED ORDER — ONDANSETRON HCL 4 MG/2ML IJ SOLN: 4.0000 mg | Freq: Once | INTRAMUSCULAR | Status: DC | PRN

## 2016-03-13 MED ORDER — SUCCINYLCHOLINE CHLORIDE 20 MG/ML IJ SOLN
INTRAMUSCULAR | Status: DC | PRN
  Administered 2016-03-13: 140 mg via INTRAVENOUS

## 2016-03-13 MED ORDER — LIDOCAINE-EPINEPHRINE 2 %-1:100000 IJ SOLN
INTRAMUSCULAR | Status: DC | PRN
  Administered 2016-03-13: 2 mL via INTRADERMAL

## 2016-03-13 MED ORDER — MEPERIDINE HCL 25 MG/ML IJ SOLN: 6.2500 mg | INTRAMUSCULAR | Status: DC | PRN

## 2016-03-13 MED ORDER — SUFENTANIL CITRATE 50 MCG/ML IV SOLN
INTRAVENOUS | Status: DC | PRN
  Administered 2016-03-13: 20 ug via INTRAVENOUS
  Administered 2016-03-13 (×2): 5 ug via INTRAVENOUS

## 2016-03-13 MED ORDER — PIPERACILLIN-TAZOBACTAM 3.375 G IVPB: 3.3750 g | Freq: Once | INTRAVENOUS | Status: DC

## 2016-03-13 MED ORDER — HEPARIN SODIUM (PORCINE) 5000 UNIT/ML IJ SOLN
5000.0000 [IU] | Freq: Three times a day (TID) | INTRAMUSCULAR | Status: DC
  Filled 2016-03-13: qty 1

## 2016-03-13 MED ORDER — LIDOCAINE 2% (20 MG/ML) 5 ML SYRINGE
INTRAMUSCULAR | Status: AC
  Filled 2016-03-13: qty 5

## 2016-03-13 MED ORDER — HYDROMORPHONE HCL 1 MG/ML IJ SOLN: 1.0000 mg | INTRAMUSCULAR | Status: DC | PRN

## 2016-03-13 MED ORDER — MIDAZOLAM HCL 2 MG/2ML IJ SOLN
INTRAMUSCULAR | Status: AC
  Filled 2016-03-13: qty 2

## 2016-03-13 MED ORDER — ROCURONIUM BROMIDE 100 MG/10ML IV SOLN
INTRAVENOUS | Status: DC | PRN
  Administered 2016-03-13: 10 mg via INTRAVENOUS

## 2016-03-13 MED ORDER — LACTATED RINGERS IV SOLN
INTRAVENOUS | Status: DC | PRN
  Administered 2016-03-13 (×2): via INTRAVENOUS

## 2016-03-13 MED ORDER — HYDROMORPHONE HCL 1 MG/ML IJ SOLN: 0.2500 mg | INTRAMUSCULAR | Status: DC | PRN

## 2016-03-13 MED ORDER — IOPAMIDOL (ISOVUE-300) INJECTION 61%
INTRAVENOUS | Status: AC
  Administered 2016-03-13: 75 mL
  Filled 2016-03-13: qty 75

## 2016-03-13 MED ORDER — SODIUM CHLORIDE 0.9 % IV SOLN
INTRAVENOUS | Status: DC
  Administered 2016-03-13: 75 mL/h via INTRAVENOUS

## 2016-03-13 MED ORDER — SUGAMMADEX SODIUM 200 MG/2ML IV SOLN
INTRAVENOUS | Status: DC | PRN
  Administered 2016-03-13: 200 mg via INTRAVENOUS

## 2016-03-13 SURGICAL SUPPLY — 47 items
APL SKNCLS STERI-STRIP NONHPOA (GAUZE/BANDAGES/DRESSINGS)
BENZOIN TINCTURE PRP APPL 2/3 (GAUZE/BANDAGES/DRESSINGS) IMPLANT
BLADE 10 SAFETY STRL DISP (BLADE) ×1 IMPLANT
BLADE SURG 15 STRL LF DISP TIS (BLADE) ×1 IMPLANT
BLADE SURG 15 STRL SS (BLADE) ×3
BNDG GAUZE ELAST 4 BULKY (GAUZE/BANDAGES/DRESSINGS) ×2 IMPLANT
CANISTER SUCT 3000ML PPV (MISCELLANEOUS) ×3 IMPLANT
COVER SURGICAL LIGHT HANDLE (MISCELLANEOUS) ×3 IMPLANT
DECANTER SPIKE VIAL GLASS SM (MISCELLANEOUS) ×1 IMPLANT
DERMACARRIERS GRAFT 1 TO 1.5 (DISPOSABLE)
DRESSING TELFA 8X3 (GAUZE/BANDAGES/DRESSINGS) IMPLANT
DRSG OPSITE 4X5.5 SM (GAUZE/BANDAGES/DRESSINGS) IMPLANT
DRSG OPSITE 6X11 MED (GAUZE/BANDAGES/DRESSINGS) IMPLANT
ELECT COATED BLADE 2.86 ST (ELECTRODE) IMPLANT
ELECT NDL BLADE 2-5/6 (NEEDLE) IMPLANT
ELECT NEEDLE BLADE 2-5/6 (NEEDLE) IMPLANT
ELECT REM PT RETURN 9FT ADLT (ELECTROSURGICAL)
ELECTRODE REM PT RTRN 9FT ADLT (ELECTROSURGICAL) IMPLANT
GAUZE PACKING FOLDED 2  STR (GAUZE/BANDAGES/DRESSINGS)
GAUZE PACKING FOLDED 2 STR (GAUZE/BANDAGES/DRESSINGS) ×1 IMPLANT
GAUZE SPONGE 4X4 16PLY XRAY LF (GAUZE/BANDAGES/DRESSINGS) ×3 IMPLANT
GLOVE BIO SURGEON STRL SZ 6.5 (GLOVE) ×1 IMPLANT
GLOVE BIO SURGEON STRL SZ7.5 (GLOVE) ×4 IMPLANT
GLOVE BIO SURGEONS STRL SZ 6.5 (GLOVE)
GOWN STRL REUS W/ TWL LRG LVL3 (GOWN DISPOSABLE) ×2 IMPLANT
GOWN STRL REUS W/TWL LRG LVL3 (GOWN DISPOSABLE) ×6
GRAFT DERMACARRIERS 1 TO 1.5 (DISPOSABLE) IMPLANT
KIT ROOM TURNOVER OR (KITS) ×3 IMPLANT
NDL BLUNT 16X1.5 OR ONLY (NEEDLE) IMPLANT
NEEDLE BLUNT 16X1.5 OR ONLY (NEEDLE) IMPLANT
NS IRRIG 1000ML POUR BTL (IV SOLUTION) ×3 IMPLANT
PAD ARMBOARD 7.5X6 YLW CONV (MISCELLANEOUS) ×6 IMPLANT
PENCIL BUTTON HOLSTER BLD 10FT (ELECTRODE) IMPLANT
SPONGE GAUZE 4X4 12PLY STER LF (GAUZE/BANDAGES/DRESSINGS) ×2 IMPLANT
SPONGE LAP 18X18 X RAY DECT (DISPOSABLE) IMPLANT
SUT CHROMIC 3 0 PS 2 (SUTURE) IMPLANT
SUT ETHILON 5 0 P 3 18 (SUTURE)
SUT NYLON ETHILON 5-0 P-3 1X18 (SUTURE) IMPLANT
SUT STEEL 2 (SUTURE) IMPLANT
SYR 50ML SLIP (SYRINGE) IMPLANT
TAPE CLOTH SOFT 2X10 (GAUZE/BANDAGES/DRESSINGS) ×2 IMPLANT
TRAY ENT MC OR (CUSTOM PROCEDURE TRAY) ×3 IMPLANT
TUBE CONNECTING 12'X1/4 (SUCTIONS) ×1
TUBE CONNECTING 12X1/4 (SUCTIONS) ×2 IMPLANT
TUBING IRRIGATION (MISCELLANEOUS) IMPLANT
WATER STERILE IRR 1000ML POUR (IV SOLUTION) ×1 IMPLANT
YANKAUER SUCT BULB TIP NO VENT (SUCTIONS) ×3 IMPLANT

## 2016-03-13 NOTE — ED Triage Notes (Signed)
Pt recently had wisdom teeth removed. Reports sore throat with difficulty swallowing and opening mouth.

## 2016-03-13 NOTE — Progress Notes (Signed)
Pt refuses to wear O2 sat monitor. Explained the need to wear so we can monitor him closely, but continues to refuse to wear. Offered the clip or the stick on prop, but refuses both. Pt requested to drink informed him that he can not drink due to having surgery. Pt requested that no more IV fluids be given to him, informed him of the need to maintain his iv access, but that I would turn the rate down. OR desk called per pt request and informed the pt it would be another 45 mins to an hour before going back. Pt noted to be restless at this time. Offered to reposition him, moved about in bed, pt then wants to sit on the side of the bed assisted him to the side of the bed. Pt c/o being cold offered him a war, blanket, but he refused.

## 2016-03-13 NOTE — Transfer of Care (Signed)
Immediate Anesthesia Transfer of Care Note  Patient: Brandon Guerrero  Procedure(s) Performed: Procedure(s): I&D Bilateral  Mandible Fractures (Bilateral)  Patient Location: PACU  Anesthesia Type:General  Level of Consciousness: awake, alert  and oriented  Airway & Oxygen Therapy: Patient Spontanous Breathing and Patient connected to face mask oxygen  Post-op Assessment: Report given to RN and Post -op Vital signs reviewed and stable  Post vital signs: Reviewed and stable  Last Vitals:  Vitals:   03/13/16 1135 03/13/16 1200  BP:  140/81  Pulse:  (!) 113  Resp:  (!) 21  Temp: 37.9 C     Last Pain:  Vitals:   03/13/16 1200  TempSrc:   PainSc: 0-No pain         Complications: No apparent anesthesia complications

## 2016-03-13 NOTE — Anesthesia Postprocedure Evaluation (Signed)
Anesthesia Post Note  Patient: Brandon Guerrero  Procedure(s) Performed: Procedure(s) (LRB): I&D Bilateral  Mandible Fractures (Bilateral)  Patient location during evaluation: PACU Anesthesia Type: General Level of consciousness: awake and alert Pain management: pain level controlled Vital Signs Assessment: post-procedure vital signs reviewed and stable Respiratory status: spontaneous breathing, nonlabored ventilation, respiratory function stable and patient connected to nasal cannula oxygen Cardiovascular status: blood pressure returned to baseline and stable Postop Assessment: no signs of nausea or vomiting Anesthetic complications: no       Last Vitals:  Vitals:   03/13/16 1458 03/13/16 1500  BP:    Pulse: (!) 105 (!) 106  Resp: 20 13  Temp: 37.2 C     Last Pain:  Vitals:   03/13/16 1200  TempSrc:   PainSc: 0-No pain                 Fenna Semel DAVID

## 2016-03-13 NOTE — Anesthesia Procedure Notes (Signed)
Procedure Name: Intubation Date/Time: 03/13/2016 2:09 PM Performed by: Nicholos JohnsMCPHAIL, Kiari Hosmer S Pre-anesthesia Checklist: Patient identified, Emergency Drugs available, Suction available, Patient being monitored and Timeout performed Patient Re-evaluated:Patient Re-evaluated prior to inductionOxygen Delivery Method: Circle system utilized Preoxygenation: Pre-oxygenation with 100% oxygen Intubation Type: IV induction, Rapid sequence and Cricoid Pressure applied Laryngoscope size: planned glidescope. Tube type: Oral Tube size: 7.5 mm Number of attempts: 1 Airway Equipment and Method: Video-laryngoscopy Placement Confirmation: ETT inserted through vocal cords under direct vision,  positive ETCO2 and breath sounds checked- equal and bilateral Secured at: 22 cm Tube secured with: Tape Dental Injury: Teeth and Oropharynx as per pre-operative assessment

## 2016-03-13 NOTE — Anesthesia Preprocedure Evaluation (Signed)
Anesthesia Evaluation  Patient identified by MRN, date of birth, ID band Patient awake    Reviewed: Allergy & Precautions, NPO status , Patient's Chart, lab work & pertinent test results  Airway Mallampati: III   Neck ROM: Full  Mouth opening: Limited Mouth Opening  Dental   Pulmonary former smoker,    Pulmonary exam normal        Cardiovascular Normal cardiovascular exam     Neuro/Psych Seizures -, Well Controlled,     GI/Hepatic   Endo/Other    Renal/GU      Musculoskeletal   Abdominal   Peds  Hematology   Anesthesia Other Findings   Reproductive/Obstetrics                             Anesthesia Physical Anesthesia Plan  ASA: III  Anesthesia Plan: General   Post-op Pain Management:    Induction: Intravenous  Airway Management Planned: Oral ETT  Additional Equipment:   Intra-op Plan:   Post-operative Plan: Extubation in OR  Informed Consent: I have reviewed the patients History and Physical, chart, labs and discussed the procedure including the risks, benefits and alternatives for the proposed anesthesia with the patient or authorized representative who has indicated his/her understanding and acceptance.     Plan Discussed with: CRNA and Surgeon  Anesthesia Plan Comments:         Anesthesia Quick Evaluation
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
## 2016-03-13 NOTE — Progress Notes (Signed)
Pt transported via bed with tele monitor to OR for I&D abscess.  Brandon Guerrero, Brandon Guerrero
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
## 2016-03-13 NOTE — ED Notes (Signed)
Pt ambulates to BR with steady gait.

## 2016-03-13 NOTE — Progress Notes (Signed)
Pt dressed sitting up in chair; CCM at bedside; pt given care instructions for incision site; pt given gauze and tape; pt instructed not to drive home; pt's car in ED parking lot; pt's brother at work and unable to pick pt up; pt ensured RN and CCM that he would call a taxi; pt awaiting d/c instructions; will cont. To monitor.  Benjamine SpragueYates, Demani Mcbrien A

## 2016-03-13 NOTE — Progress Notes (Signed)
Pt back in room s/p surgery; pt refusing tele monitor and VS at this time; CCM notified; pt wanting clothes and to get up OOB without assistance to get dressed; will cont. To monitor.  Brandon Guerrero, [name]

## 2016-03-13 NOTE — Op Note (Signed)
03/13/2016  2:24 PM  PATIENT:  Brandon Guerrero  26 y.o. male  PRE-OPERATIVE DIAGNOSIS:  Infected  Mandibular Sublingual space  POST-OPERATIVE DIAGNOSIS:  SAME  PROCEDURE:  Procedure(s): I&D   Mandible Sublingual space infection  SURGEON:  Surgeon(s): Ocie DoyneScott Morrisa Aldaba, DDS  ANESTHESIA:   local and general  EBL:  minimal  DRAINS: none   SPECIMEN:  No Specimen  COUNTS:  YES  PLAN OF CARE: Discharge to home after PACU  PATIENT DISPOSITION:  PACU - hemodynamically stable.   PROCEDURE DETAILS: Dictation # [id number]  Brandon Guerrero, DMD 03/13/2016 2:24 PM

## 2016-03-13 NOTE — Progress Notes (Signed)
Pharmacy Antibiotic Note  Brandon GrandchildKail T Guerrero is a 26 y.o. male admitted on 03/13/2016 with progressively worsening dental pain and swelling of the throat post wisdom teeth removal 4 days ago.  Pharmacy has been consulted for Unasyn dosing for dental abscess.  Baseline labs reviewed.  Noted patient received Zosyn around 0400 today.  Plan: - Unasyn 3gm IV Q8H - Monitor renal fxn, clinical progress   Weight: 251 lb 15.8 oz (114.3 kg)  Temp (24hrs), Avg:98.5 F (36.9 C), Min:98 F (36.7 C), Max:99 F (37.2 C)   Recent Labs Lab 03/13/16 0333 03/13/16 0345 03/13/16 0346  WBC 22.0*  --   --   CREATININE 1.13  --  1.10  LATICACIDVEN  --  0.78  --     Estimated Creatinine Clearance: 136.7 mL/min (by C-G formula based on SCr of 1.1 mg/dL).    Allergies  Allergen Reactions  . Bee Venom Swelling  . Tomato Swelling    Unasyn 2/25 >>  2/25 BCx x2 -    Shakayla Hickox D. Laney Potashang, PharmD, BCPS Pager:  [phone] 03/13/2016, 7:58 AM

## 2016-03-13 NOTE — ED Provider Notes (Signed)
[facility]-EMERGENCY DEPT Provider Note   CSN: [id number] Arrival date & time: 03/13/16  0200     History   Chief Complaint Chief Complaint  Patient presents with  . Sore Throat    HPI Brandon Guerrero is a 26 y.o. male with a hx of Epilepsy presents to the Emergency Department complaining of gradual, persistent, progressively worsening dental pain and swelling of the throat onset yesterday. Patient had his 4 with some teeth removed 4 days ago. He states he was sent home with Percocet for which he is out but no antibiotic. He reports being able to drink but not eat until yesterday when he reports the swelling in his throat worsened to the point where he was unable to swallow.  Patient reports feeling hot at home but denies fever chills, nausea or vomiting.  His symptoms better.  Attending to talk or open his jaw makes them worse. Patient reports associated trismus, lymphadenopathy.  Oral Surgeon: Dr. Teola BradleyJenson  The history is provided by the patient and medical records. No language interpreter was used.    Past Medical History:  Diagnosis Date  . Epilepsy (HCC)   . Seizures [facility](HCC)     Patient Active Problem List   Diagnosis Date Noted  . Seizure (HCC) 08/28/2014  . AKI (acute kidney injury) (HCC) 08/28/2014  . Hypotensive episode 08/28/2014    History reviewed. No pertinent surgical history.     Home Medications    Prior to Admission medications   Medication Sig Start Date End Date Taking? Authorizing Provider  divalproex (DEPAKOTE) 500 MG DR tablet Take 1 tablet (500 mg total) by mouth 2 (two) times daily. 02/05/16  Yes Fayrene HelperBowie Tran, PA-C  oxyCODONE-acetaminophen (PERCOCET/ROXICET) 5-325 MG tablet Take 1 tablet by mouth every 4 (four) hours as needed for severe pain.   Yes Historical Provider, MD    Family History No family history on file.  Social History Social History  Substance Use Topics  . Smoking status: Former Smoker    Years: 12.00    Types: Cigarettes  . Smokeless  tobacco: Never Used  . Alcohol use Yes     Comment: "Once every weeks"     Allergies   Bee venom and Tomato   Review of Systems Review of Systems  HENT: Positive for dental problem, facial swelling, sore throat, trouble swallowing and voice change.   All other systems reviewed and are negative.    Physical Exam Updated Vital Signs BP 141/87 (BP Location: Left Arm)   Pulse 101   Temp 99 F (37.2 C) (Rectal)   Resp 19   SpO2 97%   Physical Exam  Constitutional: He appears well-developed and well-nourished. No distress.  Awake, alert, ill-appearing  HENT:  Head: Normocephalic and atraumatic.  Right Ear: External ear normal.  Left Ear: External ear normal.  Nose: Nose normal. Right sinus exhibits no maxillary sinus tenderness and no frontal sinus tenderness. Left sinus exhibits no maxillary sinus tenderness and no frontal sinus tenderness.  Mouth/Throat: Uvula is midline. Mucous membranes are dry. There is trismus in the jaw.  Induration to the buccal mucosa or floor of the mouth  Eyes: Conjunctivae are normal. Pupils are equal, round, and reactive to light. Right eye exhibits no discharge. Left eye exhibits no discharge. No scleral icterus.  Neck: Normal range of motion. Neck supple. Erythema present.  No stridor Muffled voice, struggling to handle secretions No nuchal rigidity Significant cervical lymphadenopathy Erythema and induration of the inferior mandible with extension of erythema down  the anterior neck and onto the superior portion of the chest  Cardiovascular: Regular rhythm, normal heart sounds and intact distal pulses.  Tachycardia present.   Pulmonary/Chest: Effort normal and breath sounds normal. No stridor. Tachypnea noted. He has no wheezes.  Equal chest rise  Abdominal: Soft. Bowel sounds are normal. He exhibits no distension and no mass. There is no tenderness. There is no rebound and no guarding.  Musculoskeletal: Normal range of motion. He exhibits no  edema.  Lymphadenopathy:       Head (right side): Submental, submandibular and tonsillar adenopathy present.       Head (left side): Submental, submandibular and tonsillar adenopathy present.    He has cervical adenopathy.       Right cervical: Superficial cervical adenopathy present.       Left cervical: Superficial cervical adenopathy present.  Neurological: He is alert. GCS eye subscore is 4. GCS verbal subscore is 5. GCS motor subscore is 6.  Moves extremities without ataxia  Skin: Skin is warm and dry. He is not diaphoretic.  Psychiatric: He has a normal mood and affect.  Nursing note and vitals reviewed.    ED Treatments / Results  Labs (all labs ordered are listed, but only abnormal results are displayed) Labs Reviewed  COMPREHENSIVE METABOLIC PANEL - Abnormal; Notable for the following:       Result Value   Glucose, Bld 104 (*)    Albumin 3.4 (*)    ALT 13 (*)    All other components within normal limits  CBC WITH DIFFERENTIAL/PLATELET - Abnormal; Notable for the following:    WBC 22.0 (*)    Hemoglobin 12.8 (*)    HCT 37.7 (*)    Neutro Abs 18.6 (*)    Monocytes Absolute 2.3 (*)    All other components within normal limits  I-STAT CHEM 8, ED - Abnormal; Notable for the following:    Glucose, Bld 105 (*)    Calcium, Ion 1.09 (*)    Hemoglobin 12.9 (*)    HCT 38.0 (*)    All other components within normal limits  CULTURE, BLOOD (ROUTINE X 2)  CULTURE, BLOOD (ROUTINE X 2)  URINALYSIS, ROUTINE W REFLEX MICROSCOPIC  I-STAT CG4 LACTIC ACID, ED     Radiology Dg Chest 1 View  Result Date: 03/13/2016 CLINICAL DATA:  26 year old male with recent extraction of wisdom to presenting with fever. EXAM: CHEST 1 VIEW COMPARISON:  Chest radiograph dated 08/28/2014. FINDINGS: The heart size and mediastinal contours are within normal limits. Both lungs are clear. The visualized skeletal structures are unremarkable. IMPRESSION: No active disease. Electronically Signed   By: Elgie Collard M.D.   On: 03/13/2016 05:05   Ct Soft Tissue Neck W Contrast  Result Date: 03/13/2016 CLINICAL DATA:  Initial evaluation for acute fever, tachycardia, sore throat, trouble breathing. Recent wisdom tooth extraction. EXAM: CT NECK WITH CONTRAST TECHNIQUE: Multidetector CT imaging of the neck was performed using the standard protocol following the bolus administration of intravenous contrast. CONTRAST:  75mL ISOVUE-300 IOPAMIDOL (ISOVUE-300) INJECTION 61% COMPARISON:  None available. FINDINGS: Pharynx and larynx: There is a oblong hypodense collection within the left sublingual space that measures 2.7 x 0.8 x 1.1 cm (series 3, image 61). Although somewhat difficult to visualize, this is suspected to have tract inferiorly from the left wisdom tooth extraction site (Series 3, image 50) The oral tongue appears to be somewhat lifted superiorly, and fills the oral cavity. Postoperative changes from recent wisdom tooth extraction seen at  the bilateral mandible. Palatine tonsils mildly prominent and nearly abut each other at the midline. No tonsillar or peritonsillar abscess. Induration of the parapharyngeal fat bilaterally, more prevalent on the left. Circumferential edema with swelling within the or pharyngeal mucosa, with narrowing of the oropharyngeal airway to approximately 5 mm in AP diameter at the level of the base of tongue (series 3, image 28). There is inflammatory stranding within the bilateral submandibular spaces and submental region, extending into the parapharyngeal space bilaterally, slightly greater on the left. Inflammatory stranding extends within the subcutaneous fat down the anterior neck towards the level of the thyroid notch/ manubrium (series 3, image 82). Associated mucosal edema within the left pharynx extending inferiorly towards the piriform sinus. More mild swelling within the right pharynx. The epiglottis itself is relatively spared in normal in appearance. Vallecula is clear.  Retropharyngeal soft tissues within normal limits without significant effusion. No retropharyngeal abscess or collection. Inferiorly, true cords are relatively symmetric and within normal limits. Subglottic airway is clear. Salivary glands: The parotid glands are within normal limits. Although inflammatory changes surround the submandibular glands, the glands themselves are felt to be within normal limits. Thyroid: Thyroid within normal limits. Lymph nodes: Enlarged 14 mm right level 1A node. Mildly prominent shotty subcentimeter bilateral level 1 B nodes. Mildly enlarged level 2 nodes measure up to 14 mm on the right and 11 mm on the left. These are likely reactive. Vascular: Normal intravascular enhancement seen throughout the neck. Limited intracranial: Unremarkable. Visualized orbits: Visualized globes and orbital soft tissues within normal limits. Mastoids and visualized paranasal sinuses: Mild polypoid mucosal thickening within the inferior maxillary sinuses bilaterally, left greater than right. Visualized paranasal sinuses are otherwise clear. Visualize mastoids are clear. Visualized middle ear cavities are well pneumatized. Skeleton: No acute osseous abnormality. No worrisome lytic or blastic osseous lesions. Upper chest: As described previously, inflammatory changes from the submental region tract inferiorly within the subcutaneous fat towards the upper anterior chest to the level of the thoracic inlet. No evidence for invasion of infection into the mediastinum at this time. Scattered atelectatic changes present within the visualized lungs. Visualized lungs are otherwise clear. Other: No other significant finding. IMPRESSION: 1. Sequelae of recent wisdom tooth extraction. 2.7 x 0.8 x 1.1 cm left sublingual abscess, which appears to track inferiorly from the left mandibular wisdom tooth extraction site. There is concern for possible mild mass effect on the oral tongue superiorly which appears somewhat lifted  in the oral cavity. 2. Associated extensive inflammatory changes throughout the bilateral submandibular and parapharyngeal spaces, slightly greater on the left, with extension into the submental region. Associated mucosal edema within the pharynx as above, greatest at the level of the oropharynx. Oropharyngeal airway is narrowed to 5 mm at its most narrow point. Close clinical monitoring is recommended as these infections can sometimes be rapidly progressive and worsen acutely, resulting in airway compromise. 3. Additionally, the inflammatory changes extend inferiorly down the subcutaneous fat of the anterior neck towards the thoracic inlet, without frank invasion into the mediastinum at this time. 4. Enlarged bilateral cervical adenopathy is above, likely reactive. Critical Value/emergent results were called by telephone at the time of interpretation on 03/13/2016 at 5:05 am to Dr. Rhunette Croft , who verbally acknowledged these results. Electronically Signed   By: Rise Mu M.D.   On: 03/13/2016 05:28    Procedures Procedures (including critical care time)  CRITICAL CARE Performed by: Dierdre Forth Total critical care time: 50 minutes Critical care time was exclusive  of separately billable procedures and treating other patients. Critical care was necessary to treat or prevent imminent or life-threatening deterioration. Critical care was time spent personally by me on the following activities: development of treatment plan with patient and/or surrogate as well as nursing, discussions with consultants, evaluation of patient's response to treatment, examination of patient, obtaining history from patient or surrogate, ordering and performing treatments and interventions, ordering and review of laboratory studies, ordering and review of radiographic studies, pulse oximetry and re-evaluation of patient's condition.   Medications Ordered in ED Medications  sodium chloride 0.9 % bolus 1,000 mL  (0 mLs Intravenous Stopped 03/13/16 0415)    And  sodium chloride 0.9 % bolus 1,000 mL (0 mLs Intravenous Stopped 03/13/16 0415)  morphine 4 MG/ML injection 4 mg (4 mg Intravenous Given 03/13/16 0346)  piperacillin-tazobactam (ZOSYN) IVPB 3.375 g (0 g Intravenous Stopped 03/13/16 0423)  iopamidol (ISOVUE-300) 61 % injection (75 mLs  Contrast Given 03/13/16 0404)  dexamethasone (DECADRON) injection 10 mg (10 mg Intravenous Given 03/13/16 0659)     Initial Impression / Assessment and Plan / ED Course  I have reviewed the triage vital signs and the nursing notes.  Pertinent labs & imaging results that were available during my care of the patient were reviewed by me and considered in my medical decision making (see chart for details).  Clinical Course as of Mar 13 722  Wynelle Link Mar 13, 2016  9604 Dr. Teola Bradley just called [place] back. He recommends medicine admit with antibiotics and give decadron. He will see pt here. Pt reassessed. Stable for now. Will call ICU first.   [AN]  0706 ICU will see the patient, and decide on the admit location.  [AN]  5409 The patient was discussed with and seen by Dr. Derwood Kaplan who agrees with the treatment plan.   [HM]    Clinical Course User Index [AN] Derwood Kaplan, MD [HM] Dierdre Forth, PA-C    Patient presents with worsening signs of infection several days after was tooth removal. CT scan shows left sublingual abscess which tracks inferiorly from the left mandibular wisdom tooth extraction site with mild mass effect on the tongue. There is associated extensive inflammatory changes throughout the bilateral submandibular and pharyngeal spaces with inflammatory changes extending inferiorly down into the subcutaneous fat of the anterior neck without frank invasion of the mediastinum at this time.  Patient is critically ill. Sepsis screen initiated. Patient is afebrile with normal lactate however remains tachycardic. Blood cultures obtained and antibiotics  started.  Patient will be admitted to ICU. Dr. Teola Bradley will see patient.  BP 139/91   Pulse 105   Temp 99 F (37.2 C) (Rectal)   Resp 16   SpO2 96%      Final Clinical Impressions(s) / ED Diagnoses   Final diagnoses:  Sublingual abscess  S/P wisdom tooth extraction    New Prescriptions New Prescriptions   No medications on file     Dierdre Forth, PA-C 03/13/16 8119    Derwood Kaplan, MD 03/14/16 [phone]

## 2016-03-13 NOTE — Op Note (Signed)
NAMCarloyn Manner:  Cones, Raheem                  ACCOUNT NO.:  [id number]  MEDICAL RECORD NO.:  [id number]  LOCATION:  A08C                         FACILITY:  [facility]  PHYSICIAN:  Georgia LopesScott M. Matea Stanard, M.D.  DATE OF BIRTH:  Jan 29, 1990  DATE OF PROCEDURE:  03/13/2016 DATE OF DISCHARGE:                              OPERATIVE REPORT   PREOPERATIVE DIAGNOSIS:  Infected mandible-sublingual space.  POSTOPERATIVE DIAGNOSIS:  Infected mandible-sublingual space.  PROCEDURE:  Incision and drainage, mandible-sublingual space infection.  ANESTHESIA:  General.  Dr. Michelle Piperssey, attending.  Oral intubation.  DESCRIPTION OF PROCEDURE:  The patient was taken to the operating room, placed on the table in supine position.  General anesthesia was administered intravenously and an oral endotracheal tube was placed and secured.  The eyes were protected and the patient was shaved, prepped, and draped for the procedure.  Time-out was performed.  Then, the sublingual area was palpated and over the area of most fluctuance, 2% lidocaine with 1:200,000 epinephrine was infiltrated in the skin area. Then, a #15 blade was used to make a skin incision approximately 1.5 cm. Then, a Cryer elevator was inserted into the incision and through palpating intraorally to the floor of the mouth, the area of infection was eventually entered and frank purulent exudate began draining. Aerobic and anaerobic culture and Gram stain were taken at that point. Then, the Cryer elevator hemostat was used to spread through the loculations in the infected area.  Then, the area was irrigated and then a quarter-inch Penrose drain was placed and sutured to the skin with a 3- 0 nylon.  The area was then dressed with 4x4s and tape and the patient was awakened and taken from the operating room to recovery room, breathing spontaneously in good condition.  ESTIMATED BLOOD LOSS:  Minimal.  COMPLICATIONS:  None.  SPECIMENS:  None.     Georgia LopesScott M. Aliscia Clayton,  M.D.     SMJ/MEDQ  D:  03/13/2016  T:  03/13/2016  Job:  [id number]

## 2016-03-13 NOTE — Progress Notes (Signed)
Placed on tele to monitor  

## 2016-03-13 NOTE — H&P (Signed)
HISTORY AND PHYSICAL  Brandon Guerrero is a 26 y.o. male patient with CC: Dental pain, swelling  HPI: Patient underwent removal complete bony impacted teeth 4 days ago under IV sedation. No complications. Post-operatively, pt developed swelling and discomfort. To ER today with increased pain and swelling. Difficulty swallowing.   1. Sublingual abscess   2. Fever   3. S/P wisdom tooth extraction     Past Medical History:  Diagnosis Date  . Epilepsy (HCC)   . Seizures (HCC)     Current Facility-Administered Medications  Medication Dose Route Frequency Provider Last Rate Last Dose  . 0.9 %  sodium chloride infusion  250 mL Intravenous PRN Jeanella CrazeBrandi L Ollis, NP      . 0.9 %  sodium chloride infusion   Intravenous Continuous Jeanella CrazeBrandi L Ollis, NP 75 mL/hr at 03/13/16 1000    . acetaminophen (TYLENOL) tablet 650 mg  650 mg Oral Q6H PRN Jeanella CrazeBrandi L Ollis, NP      . Ampicillin-Sulbactam (UNASYN) 3 g in sodium chloride 0.9 % 100 mL IVPB  3 g Intravenous Q8H Gerilyn Nestlehuy D Dang, RPH      . dexamethasone (DECADRON) injection 5 mg  5 mg Intravenous Q6H Praveen Mannam, MD      . heparin injection 5,000 Units  5,000 Units Subcutaneous Q8H Brandi L Ollis, NP      . insulin aspart (novoLOG) injection 0-15 Units  0-15 Units Subcutaneous Q4H Praveen Mannam, MD      . ondansetron (ZOFRAN) injection 4 mg  4 mg Intravenous Q6H PRN Jeanella CrazeBrandi L Ollis, NP      . valproate (DEPACON) 500 mg in dextrose 5 % 50 mL IVPB  500 mg Intravenous Q12H Praveen Mannam, MD   500 mg at 03/13/16 0948   Allergies  Allergen Reactions  . Bee Venom Swelling  . Tomato Swelling   Active Problems:   Dental abscess  Vitals: Blood pressure (!) 148/90, pulse 96, temperature 99.4 F (37.4 C), temperature source Oral, resp. rate 19, weight 114.3 kg (251 lb 15.8 oz), SpO2 96 %. Lab results: Results for orders placed or performed during the hospital encounter of 03/13/16 (from the past 24 hour(s))  Comprehensive metabolic panel     Status: Abnormal    Collection Time: 03/13/16  3:33 AM  Result Value Ref Range   Sodium 140 135 - 145 mmol/L   Potassium 3.7 3.5 - 5.1 mmol/L   Chloride 102 101 - 111 mmol/L   CO2 26 22 - 32 mmol/L   Glucose, Bld 104 (H) 65 - 99 mg/dL   BUN 7 6 - 20 mg/dL   Creatinine, Ser 1.611.13 0.61 - 1.24 mg/dL   Calcium 9.4 8.9 - 09.610.3 mg/dL   Total Protein 7.3 6.5 - 8.1 g/dL   Albumin 3.4 (L) 3.5 - 5.0 g/dL   AST 20 15 - 41 U/L   ALT 13 (L) 17 - 63 U/L   Alkaline Phosphatase 55 38 - 126 U/L   Total Bilirubin 1.0 0.3 - 1.2 mg/dL   GFR calc non Af Amer >60 >60 mL/min   GFR calc Af Amer >60 >60 mL/min   Anion gap 12 5 - 15  CBC WITH DIFFERENTIAL     Status: Abnormal   Collection Time: 03/13/16  3:33 AM  Result Value Ref Range   WBC 22.0 (H) 4.0 - 10.5 K/uL   RBC 4.29 4.22 - 5.81 MIL/uL   Hemoglobin 12.8 (L) 13.0 - 17.0 g/dL   HCT 04.537.7 (L) 40.939.0 -  52.0 %   MCV 87.9 78.0 - 100.0 fL   MCH 29.8 26.0 - 34.0 pg   MCHC 34.0 30.0 - 36.0 g/dL   RDW 40.9 81.1 - 91.4 %   Platelets 213 150 - 400 K/uL   Neutrophils Relative % 84 %   Neutro Abs 18.6 (H) 1.7 - 7.7 K/uL   Lymphocytes Relative 5 %   Lymphs Abs 1.1 0.7 - 4.0 K/uL   Monocytes Relative 11 %   Monocytes Absolute 2.3 (H) 0.1 - 1.0 K/uL   Eosinophils Relative 0 %   Eosinophils Absolute 0.0 0.0 - 0.7 K/uL   Basophils Relative 0 %   Basophils Absolute 0.0 0.0 - 0.1 K/uL  I-Stat CG4 Lactic Acid, ED  (not at  [facility])     Status: None   Collection Time: 03/13/16  3:45 AM  Result Value Ref Range   Lactic Acid, Venous 0.78 0.5 - 1.9 mmol/L  I-stat chem 8, ed     Status: Abnormal   Collection Time: 03/13/16  3:46 AM  Result Value Ref Range   Sodium 139 135 - 145 mmol/L   Potassium 3.6 3.5 - 5.1 mmol/L   Chloride 104 101 - 111 mmol/L   BUN 7 6 - 20 mg/dL   Creatinine, Ser 7.82 0.61 - 1.24 mg/dL   Glucose, Bld 956 (H) 65 - 99 mg/dL   Calcium, Ion 2.13 (L) 1.15 - 1.40 mmol/L   TCO2 25 0 - 100 mmol/L   Hemoglobin 12.9 (L) 13.0 - 17.0 g/dL   HCT 08.6 (L) 57.8 - 46.9 %    Radiology Results: Dg Chest 1 View  Result Date: 03/13/2016 CLINICAL DATA:  26 year old male with recent extraction of wisdom to presenting with fever. EXAM: CHEST 1 VIEW COMPARISON:  Chest radiograph dated 08/28/2014. FINDINGS: The heart size and mediastinal contours are within normal limits. Both lungs are clear. The visualized skeletal structures are unremarkable. IMPRESSION: No active disease. Electronically Signed   By: Elgie Collard M.D.   On: 03/13/2016 05:05   Ct Soft Tissue Neck W Contrast  Result Date: 03/13/2016 CLINICAL DATA:  Initial evaluation for acute fever, tachycardia, sore throat, trouble breathing. Recent wisdom tooth extraction. EXAM: CT NECK WITH CONTRAST TECHNIQUE: Multidetector CT imaging of the neck was performed using the standard protocol following the bolus administration of intravenous contrast. CONTRAST:  75mL ISOVUE-300 IOPAMIDOL (ISOVUE-300) INJECTION 61% COMPARISON:  None available. FINDINGS: Pharynx and larynx: There is a oblong hypodense collection within the left sublingual space that measures 2.7 x 0.8 x 1.1 cm (series 3, image 61). Although somewhat difficult to visualize, this is suspected to have tract inferiorly from the left wisdom tooth extraction site (Series 3, image 50) The oral tongue appears to be somewhat lifted superiorly, and fills the oral cavity. Postoperative changes from recent wisdom tooth extraction seen at the bilateral mandible. Palatine tonsils mildly prominent and nearly abut each other at the midline. No tonsillar or peritonsillar abscess. Induration of the parapharyngeal fat bilaterally, more prevalent on the left. Circumferential edema with swelling within the or pharyngeal mucosa, with narrowing of the oropharyngeal airway to approximately 5 mm in AP diameter at the level of the base of tongue (series 3, image 28). There is inflammatory stranding within the bilateral submandibular spaces and submental region, extending into the  parapharyngeal space bilaterally, slightly greater on the left. Inflammatory stranding extends within the subcutaneous fat down the anterior neck towards the level of the thyroid notch/ manubrium (series 3, image 82). Associated mucosal  edema within the left pharynx extending inferiorly towards the piriform sinus. More mild swelling within the right pharynx. The epiglottis itself is relatively spared in normal in appearance. Vallecula is clear. Retropharyngeal soft tissues within normal limits without significant effusion. No retropharyngeal abscess or collection. Inferiorly, true cords are relatively symmetric and within normal limits. Subglottic airway is clear. Salivary glands: The parotid glands are within normal limits. Although inflammatory changes surround the submandibular glands, the glands themselves are felt to be within normal limits. Thyroid: Thyroid within normal limits. Lymph nodes: Enlarged 14 mm right level 1A node. Mildly prominent shotty subcentimeter bilateral level 1 B nodes. Mildly enlarged level 2 nodes measure up to 14 mm on the right and 11 mm on the left. These are likely reactive. Vascular: Normal intravascular enhancement seen throughout the neck. Limited intracranial: Unremarkable. Visualized orbits: Visualized globes and orbital soft tissues within normal limits. Mastoids and visualized paranasal sinuses: Mild polypoid mucosal thickening within the inferior maxillary sinuses bilaterally, left greater than right. Visualized paranasal sinuses are otherwise clear. Visualize mastoids are clear. Visualized middle ear cavities are well pneumatized. Skeleton: No acute osseous abnormality. No worrisome lytic or blastic osseous lesions. Upper chest: As described previously, inflammatory changes from the submental region tract inferiorly within the subcutaneous fat towards the upper anterior chest to the level of the thoracic inlet. No evidence for invasion of infection into the mediastinum at  this time. Scattered atelectatic changes present within the visualized lungs. Visualized lungs are otherwise clear. Other: No other significant finding. IMPRESSION: 1. Sequelae of recent wisdom tooth extraction. 2.7 x 0.8 x 1.1 cm left sublingual abscess, which appears to track inferiorly from the left mandibular wisdom tooth extraction site. There is concern for possible mild mass effect on the oral tongue superiorly which appears somewhat lifted in the oral cavity. 2. Associated extensive inflammatory changes throughout the bilateral submandibular and parapharyngeal spaces, slightly greater on the left, with extension into the submental region. Associated mucosal edema within the pharynx as above, greatest at the level of the oropharynx. Oropharyngeal airway is narrowed to 5 mm at its most narrow point. Close clinical monitoring is recommended as these infections can sometimes be rapidly progressive and worsen acutely, resulting in airway compromise. 3. Additionally, the inflammatory changes extend inferiorly down the subcutaneous fat of the anterior neck towards the thoracic inlet, without frank invasion into the mediastinum at this time. 4. Enlarged bilateral cervical adenopathy is above, likely reactive. Critical Value/emergent results were called by telephone at the time of interpretation on 03/13/2016 at 5:05 am to Dr. Rhunette Croft , who verbally acknowledged these results. Electronically Signed   By: Rise Mu M.D.   On: 03/13/2016 05:28   General appearance: alert, cooperative, no distress and No difficulty breathing. No dysphonia. Head: Normocephalic, without obvious abnormality, atraumatic Eyes: negative Nose: Nares normal. Septum midline. Mucosa normal. No drainage or sinus tenderness. Throat: trismus to 15mm. Unable to adequately visualize pharynx.  Neck: supple, symmetrical, trachea midline, thyroid not enlarged, symmetric, no tenderness/mass/nodules and Mild to moderate sublingual edema  with tenderness.  Resp: clear to auscultation bilaterally Cardio: regular rate and rhythm, S1, S2 normal, no murmur, click, rub or gallop  Assessment: Post-operative Sublingual Abscess Left  Greater than right mandible.   Plan: I and D today.   Georgia Lopes 03/13/2016

## 2016-03-13 NOTE — Progress Notes (Signed)
Spoke with bedside RN Asher MuirJamie.  Pt just returned from PACU and is wanting to go home.  Threatening AMA.  Dr Sherene SiresWert, eMD, notified of pt's wishes.  He was able to get in touch with Canary BrimBrandi Ollis to go by and see pt and eval.

## 2016-03-13 NOTE — Progress Notes (Signed)
Per Dr. Jensen, pt ok to dc home; CCM caBarbette Merinolled to make aware; pt will need Augumentin 875 mg PO BID X 10 days per Dr. Barbette MerinoJensen; will await for d/c order and scrips; will cont. To monitor.  Benjamine SpragueYates, Keyana Guevara A

## 2016-03-14 ENCOUNTER — Encounter (HOSPITAL_COMMUNITY): Payer: Self-pay | Admitting: Oral Surgery

## 2016-03-14 LAB — HIV ANTIBODY (ROUTINE TESTING W REFLEX): HIV SCREEN 4TH GENERATION: NONREACTIVE

## 2016-03-18 LAB — AEROBIC/ANAEROBIC CULTURE W GRAM STAIN (SURGICAL/DEEP WOUND)

## 2016-03-18 LAB — CULTURE, BLOOD (ROUTINE X 2)
CULTURE: NO GROWTH
Culture: NO GROWTH

## 2017-05-27 ENCOUNTER — Encounter (HOSPITAL_COMMUNITY): Payer: Self-pay

## 2017-05-27 ENCOUNTER — Other Ambulatory Visit: Payer: Self-pay

## 2017-05-27 ENCOUNTER — Emergency Department (HOSPITAL_COMMUNITY)
Admission: EM | Admit: 2017-05-27 | Discharge: 2017-05-27 | Disposition: A | Discharge status: Home / Self Care | Payer: Medicare Other | Attending: Emergency Medicine | Admitting: Emergency Medicine

## 2017-05-27 DIAGNOSIS — Z760 Encounter for issue of repeat prescription: Secondary | ICD-10-CM | POA: Diagnosis not present

## 2017-05-27 DIAGNOSIS — Z87891 Personal history of nicotine dependence: Secondary | ICD-10-CM | POA: Insufficient documentation

## 2017-05-27 DIAGNOSIS — Z79899 Other long term (current) drug therapy: Secondary | ICD-10-CM | POA: Diagnosis not present

## 2017-05-27 MED ORDER — DIVALPROEX SODIUM 500 MG PO DR TAB: 500.0000 mg | DELAYED_RELEASE_TABLET | Freq: Two times a day (BID) | ORAL | 0 refills | Status: DC

## 2017-05-27 MED ORDER — DIVALPROEX SODIUM 250 MG PO DR TAB
500.0000 mg | DELAYED_RELEASE_TABLET | Freq: Once | ORAL | Status: AC
  Administered 2017-05-27: 500 mg via ORAL
  Filled 2017-05-27: qty 2

## 2017-05-27 NOTE — Discharge Instructions (Signed)
Take medication as prescribed.  Establish care with a PCP. Use number on sheet provided to help you find one.  Return for any seizure like activity.  If you develop worsening or new concerning symptoms you can return to the emergency department for re-evaluation.

## 2017-05-27 NOTE — ED Triage Notes (Addendum)
Pt endorses being out of his depakote since yesterday due to losing his pcp. VSS. No other complaints.

## 2017-05-27 NOTE — ED Notes (Signed)
Called patient to move to Triage. Unable to locate patient at this time. 

## 2017-05-27 NOTE — ED Provider Notes (Signed)
[facility] EMERGENCY DEPARTMENT Provider Note   CSN: [id number] Arrival date & time: 05/27/17  1625     History   Chief Complaint Chief Complaint  Patient presents with  . Medication Refill    HPI Brandon Guerrero is a 27 y.o. male with past medical history of epilepsy on Depakote who presents emergency department today for medication refill.  Patient states that he ran out of his Depakote this morning.  He is post take 500 mg twice daily.  He took his morning dose but is due for his second dose at this current time but does not have any more medication.  He states that he has not been able to follow-up with his PCP as they do not take his insurance anymore.  He is currently seeking a different PCP that accepts his insurance.  The patient is currently asymptomatic.  He denies any seizure-like activity.  No current symptoms at this time.  HPI  Past Medical History:  Diagnosis Date  . Epilepsy (HCC)   . Seizures [facility])     Patient Active Problem List   Diagnosis Date Noted  . Dental abscess 03/13/2016  . Seizure (HCC) 08/28/2014  . AKI (acute kidney injury) (HCC) 08/28/2014  . Hypotensive episode 08/28/2014    Past Surgical History:  Procedure Laterality Date  . DEBRIDEMENT MANDIBLE Bilateral 03/13/2016   Procedure: I&D Bilateral  Mandible Fractures;  Surgeon: Ocie Doyne, DDS;  Location: [facility] OR;  Service: Oral Surgery;  Laterality: Bilateral;        Home Medications    Prior to Admission medications   Medication Sig Start Date End Date Taking? Authorizing Provider  acetaminophen (TYLENOL) 325 MG tablet Take 2 tablets (650 mg total) by mouth every 6 (six) hours as needed for mild pain or fever (temp > 101.5). 03/13/16   Jeanella Craze, NP  divalproex (DEPAKOTE) 500 MG DR tablet Take 1 tablet (500 mg total) by mouth 2 (two) times daily. 02/05/16   Fayrene Helper, PA-C  oxyCODONE-acetaminophen (PERCOCET/ROXICET) 5-325 MG tablet Take 1 tablet by mouth every 4  (four) hours as needed for severe pain.    [provider]    Family History History reviewed. No pertinent family history.  Social History Social History   Tobacco Use  . Smoking status: Former Smoker    Years: 12.00    Types: Cigarettes  . Smokeless tobacco: Never Used  Substance Use Topics  . Alcohol use: Yes    Comment: "Once every weeks"  . Drug use: No     Allergies   Bee venom and Tomato   Review of Systems Review of Systems  All other systems reviewed and are negative.    Physical Exam Updated Vital Signs BP 120/80 (BP Location: Left Arm)   Pulse 80   Temp 98.5 F (36.9 C) (Oral)   Resp 16   Ht  (1.88 m)   Wt 122.9 kg (271 lb)   SpO2 99%   BMI 34.79 kg/m   Physical Exam  Constitutional: He appears well-developed and well-nourished.  HENT:  Head: Normocephalic and atraumatic.  Right Ear: External ear normal.  Left Ear: External ear normal.  Eyes: Conjunctivae are normal. Right eye exhibits no discharge. Left eye exhibits no discharge. No scleral icterus.  Pulmonary/Chest: Effort normal. No respiratory distress.  Neurological: He is alert.  Skin: No pallor.  Psychiatric: He has a normal mood and affect.  Nursing note and vitals reviewed.    ED Treatments /  Results  Labs (all labs ordered are listed, but only abnormal results are displayed) Labs Reviewed - No data to display  EKG None  Radiology No results found.  Procedures Procedures (including critical care time)  Medications Ordered in ED Medications  divalproex (DEPAKOTE) DR tablet 500 mg (has no administration in time range)     Initial Impression / Assessment and Plan / ED Course  I have reviewed the triage vital signs and the nursing notes.  Pertinent labs & imaging results that were available during my care of the patient were reviewed by me and considered in my medical decision making (see chart for details).     Pt here for refill of medication.  Medication is not a controlled substance.  Patient reviewed in [place] Controlled Substance Reporting System. Will refill medication here. Discussed need to follow up with PCP and establish care for further follow ups. Return precautions discussed. Dose given in emergency department.  Pt is safe for discharge at this time.  Final Clinical Impressions(s) / ED Diagnoses   Final diagnoses:  Medication refill    ED Discharge Orders        Ordered    divalproex (DEPAKOTE) 500 MG DR tablet  2 times daily     05/27/17 1946       Princella Pellegrini 05/27/17 1946    Tegeler, Canary Brim, MD 05/28/17 [phone]

## 2017-06-14 ENCOUNTER — Encounter (HOSPITAL_COMMUNITY): Payer: Self-pay | Admitting: Emergency Medicine

## 2017-06-14 ENCOUNTER — Other Ambulatory Visit: Payer: Self-pay

## 2017-06-14 ENCOUNTER — Emergency Department (HOSPITAL_COMMUNITY)
Admission: EM | Admit: 2017-06-14 | Discharge: 2017-06-14 | Disposition: A | Discharge status: Home / Self Care | Payer: Medicare Other | Attending: Physician Assistant | Admitting: Physician Assistant

## 2017-06-14 DIAGNOSIS — Z760 Encounter for issue of repeat prescription: Secondary | ICD-10-CM | POA: Insufficient documentation

## 2017-06-14 DIAGNOSIS — Z87891 Personal history of nicotine dependence: Secondary | ICD-10-CM | POA: Diagnosis not present

## 2017-06-14 DIAGNOSIS — Z79899 Other long term (current) drug therapy: Secondary | ICD-10-CM | POA: Insufficient documentation

## 2017-06-14 MED ORDER — DIVALPROEX SODIUM 250 MG PO DR TAB
500.0000 mg | DELAYED_RELEASE_TABLET | Freq: Two times a day (BID) | ORAL | Status: DC
  Administered 2017-06-14: 500 mg via ORAL
  Filled 2017-06-14: qty 2

## 2017-06-14 MED ORDER — DIVALPROEX SODIUM 500 MG PO DR TAB: 500.0000 mg | DELAYED_RELEASE_TABLET | Freq: Two times a day (BID) | ORAL | 1 refills | Status: DC

## 2017-06-14 NOTE — ED Triage Notes (Signed)
Patient to ED for medication refill of his depakote. States he is trying to get established with a PCP but hasn't yet.

## 2017-06-14 NOTE — Care Management (Signed)
Patient discharged home from the ED. CM attempted to reach the patient using the telephone number he left with his ED nurse and ED PA. A recording states the telephone number is not accepting calls at this time. Rubie Maid RN CCM

## 2017-06-14 NOTE — ED Provider Notes (Signed)
[facility] EMERGENCY DEPARTMENT Provider Note   CSN: [id number] Arrival date & time: 06/14/17  1645     History   Chief Complaint Chief Complaint  Patient presents with  . Medication Refill    HPI Brandon Guerrero is a 27 y.o. male who presents for med refill . He has a pmh of epilepsy,. He has been out of his meds for 2 weeks. He denies any seizures. No pcp.  HPI  Past Medical History:  Diagnosis Date  . Epilepsy (HCC)   . Seizures [facility])     Patient Active Problem List   Diagnosis Date Noted  . Dental abscess 03/13/2016  . Seizure (HCC) 08/28/2014  . AKI (acute kidney injury) (HCC) 08/28/2014  . Hypotensive episode 08/28/2014    Past Surgical History:  Procedure Laterality Date  . DEBRIDEMENT MANDIBLE Bilateral 03/13/2016   Procedure: I&D Bilateral  Mandible Fractures;  Surgeon: Ocie Doyne, DDS;  Location: [facility] OR;  Service: Oral Surgery;  Laterality: Bilateral;        Home Medications    Prior to Admission medications   Medication Sig Start Date End Date Taking? Authorizing Provider  acetaminophen (TYLENOL) 325 MG tablet Take 2 tablets (650 mg total) by mouth every 6 (six) hours as needed for mild pain or fever (temp > 101.5). 03/13/16   Jeanella Craze, NP  divalproex (DEPAKOTE) 500 MG DR tablet Take 1 tablet (500 mg total) by mouth 2 (two) times daily. 06/14/17   Arthor Captain, PA-C  oxyCODONE-acetaminophen (PERCOCET/ROXICET) 5-325 MG tablet Take 1 tablet by mouth every 4 (four) hours as needed for severe pain.    [provider]    Family History No family history on file.  Social History Social History   Tobacco Use  . Smoking status: Former Smoker    Years: 12.00    Types: Cigarettes  . Smokeless tobacco: Never Used  Substance Use Topics  . Alcohol use: Yes    Comment: "Once every weeks"  . Drug use: No     Allergies   Bee venom and Tomato   Review of Systems Review of Systems Denies seizures fevers,  chills  Physical Exam Updated Vital Signs BP 116/81 (BP Location: Right Arm)   Pulse 84   Temp 98.6 F (37 C) (Oral)   Resp 16   SpO2 100%   Physical Exam  Constitutional: He appears well-developed and well-nourished. No distress.  HENT:  Head: Normocephalic and atraumatic.  Eyes: Conjunctivae are normal. No scleral icterus.  Neck: Normal range of motion. Neck supple.  Cardiovascular: Normal rate, regular rhythm and normal heart sounds.  Pulmonary/Chest: Effort normal and breath sounds normal. No respiratory distress.  Abdominal: Soft. There is no tenderness.  Musculoskeletal: He exhibits no edema.  Neurological: He is alert.  Skin: Skin is warm and dry. He is not diaphoretic.  Psychiatric: His behavior is normal.  Nursing note and vitals reviewed.    ED Treatments / Results  Labs (all labs ordered are listed, but only abnormal results are displayed) Labs Reviewed - No data to display  EKG None  Radiology No results found.  Procedures Procedures (including critical care time)  Medications Ordered in ED Medications  divalproex (DEPAKOTE) DR tablet 500 mg (500 mg Oral Given 06/14/17 1723)     Initial Impression / Assessment and Plan / ED Course  I have reviewed the triage vital signs and the nursing notes.  Pertinent labs & imaging results that were available during my care  of the patient were reviewed by me and considered in my medical decision making (see chart for details).      Patient with need for depakote. I have asked case managemnt to assist the patient in PCP follow up.  Final Clinical Impressions(s) / ED Diagnoses   Final diagnoses:  Medication refill    ED Discharge Orders        Ordered    divalproex (DEPAKOTE) 500 MG DR tablet  2 times daily     06/14/17 1716       Arthor Captain, PA-C 06/14/17 1726    Mackuen, Cindee Salt, MD 06/16/17 0017

## 2017-07-14 ENCOUNTER — Encounter (HOSPITAL_COMMUNITY): Payer: Self-pay | Admitting: Emergency Medicine

## 2017-07-14 ENCOUNTER — Other Ambulatory Visit: Payer: Self-pay

## 2017-07-14 ENCOUNTER — Emergency Department (HOSPITAL_COMMUNITY)
Admission: EM | Admit: 2017-07-14 | Discharge: 2017-07-14 | Disposition: A | Discharge status: Home / Self Care | Payer: Medicare Other | Attending: Emergency Medicine | Admitting: Emergency Medicine

## 2017-07-14 DIAGNOSIS — Z760 Encounter for issue of repeat prescription: Secondary | ICD-10-CM | POA: Diagnosis not present

## 2017-07-14 DIAGNOSIS — Z5321 Procedure and treatment not carried out due to patient leaving prior to being seen by health care provider: Secondary | ICD-10-CM | POA: Insufficient documentation

## 2017-07-14 NOTE — ED Triage Notes (Addendum)
Patient needs a refill of Depakote. Patient ran out. Patient states his insurance is not being taken by his regular doctor and could not get a refill. Patient states he takes 500 mg in the am and 500 mg at night.

## 2017-07-14 NOTE — ED Notes (Signed)
Called patient to go to a room and no answer 

## 2017-07-14 NOTE — ED Notes (Signed)
Pt not in lobby, restroom, or out in the front of the ED when called to be roomed.

## 2017-07-14 NOTE — ED Notes (Signed)
Called patient to be bedded and patient did not answer.

## 2017-07-16 ENCOUNTER — Encounter (HOSPITAL_COMMUNITY): Payer: Self-pay | Admitting: Emergency Medicine

## 2017-07-16 ENCOUNTER — Emergency Department (HOSPITAL_COMMUNITY)
Admission: EM | Admit: 2017-07-16 | Discharge: 2017-07-16 | Disposition: A | Discharge status: Home / Self Care | Payer: Medicare Other | Attending: Emergency Medicine | Admitting: Emergency Medicine

## 2017-07-16 DIAGNOSIS — Z79899 Other long term (current) drug therapy: Secondary | ICD-10-CM | POA: Diagnosis not present

## 2017-07-16 DIAGNOSIS — Z760 Encounter for issue of repeat prescription: Secondary | ICD-10-CM | POA: Diagnosis not present

## 2017-07-16 DIAGNOSIS — R569 Unspecified convulsions: Secondary | ICD-10-CM | POA: Diagnosis not present

## 2017-07-16 MED ORDER — DIVALPROEX SODIUM 500 MG PO DR TAB: 500.0000 mg | DELAYED_RELEASE_TABLET | Freq: Two times a day (BID) | ORAL | 1 refills | Status: DC

## 2017-07-16 NOTE — ED Triage Notes (Signed)
Patient here from refill of Depakote meds. 1000mg  daily.

## 2017-07-16 NOTE — Discharge Instructions (Signed)
You were seen in the emergency department for refill of your Depakote.  We are providing a refill for 1 month and please try to work on getting a primary care doctor.  Please return to the emergency department if any problems.

## 2017-07-16 NOTE — ED Provider Notes (Signed)
[facility]-EMERGENCY DEPT Provider Note   CSN: [id number] Arrival date & time: 07/16/17  0945     History   Chief Complaint Chief Complaint  Patient presents with  . Medication Refill    HPI Brandon Guerrero is a 27 y.o. male.  He presents to the emergency department asking for refill of his Depakote.  He is on this for seizures and has not had a seizure in over 6 months.  He was getting the medication prescribed by neurologist but when he switched insurances he got it prescribed by PCP.  Since then his PCP is dropped him he found out about a week ago.  He is going to work on getting a new PCP but he is looking for Depakote 500 mg twice daily from month until he can get this sorted out.  He is denying any medical complaints he has been on a stable dose for many years and does not feel like it is too much or too little medicine for him.  No dizziness no confusion no chest pain no shortness of breath no unsteadiness ambulating.  The history is provided by the patient.  Medication Refill  Medications/supplies requested:  Depakote Reason for request:  Prescriptions expired and medications ran out Medications taken before: yes - see home medications   Patient has complete original prescription information: no     Past Medical History:  Diagnosis Date  . Epilepsy (HCC)   . Seizures [facility])     Patient Active Problem List   Diagnosis Date Noted  . Dental abscess 03/13/2016  . Seizure (HCC) 08/28/2014  . AKI (acute kidney injury) (HCC) 08/28/2014  . Hypotensive episode 08/28/2014    Past Surgical History:  Procedure Laterality Date  . DEBRIDEMENT MANDIBLE Bilateral 03/13/2016   Procedure: I&D Bilateral  Mandible Fractures;  Surgeon: Ocie Doyne, DDS;  Location: [facility] OR;  Service: Oral Surgery;  Laterality: Bilateral;        Home Medications    Prior to Admission medications   Medication Sig Start Date End Date Taking? Authorizing Provider  acetaminophen  (TYLENOL) 325 MG tablet Take 2 tablets (650 mg total) by mouth every 6 (six) hours as needed for mild pain or fever (temp > 101.5). 03/13/16   Jeanella Craze, NP  divalproex (DEPAKOTE) 500 MG DR tablet Take 1 tablet (500 mg total) by mouth 2 (two) times daily. 06/14/17   Arthor Captain, PA-C  oxyCODONE-acetaminophen (PERCOCET/ROXICET) 5-325 MG tablet Take 1 tablet by mouth every 4 (four) hours as needed for severe pain.    [provider]    Family History No family history on file.  Social History Social History   Tobacco Use  . Smoking status: Former Smoker    Years: 12.00    Types: Cigarettes  . Smokeless tobacco: Never Used  Substance Use Topics  . Alcohol use: Yes    Comment: "Once every weeks"  . Drug use: No     Allergies   Bee venom and Tomato   Review of Systems Review of Systems  Constitutional: Negative for fever.  HENT: Negative for sore throat.   Eyes: Negative for visual disturbance.  Respiratory: Negative for shortness of breath.   Cardiovascular: Negative for chest pain.  Gastrointestinal: Negative for abdominal pain.  Genitourinary: Negative for dysuria.  Skin: Negative for rash.  Neurological: Positive for seizures.     Physical Exam Updated Vital Signs BP (!) 147/87 (BP Location: Left Arm)   Pulse 87   Temp  98 F (36.7 C) (Oral)   Resp 19   SpO2 99%   Physical Exam  Constitutional: He appears well-developed and well-nourished.  HENT:  Head: Normocephalic and atraumatic.  Eyes: Conjunctivae are normal.  Neck: Neck supple.  Cardiovascular: Normal rate, regular rhythm and normal heart sounds.  Pulmonary/Chest: Effort normal. No stridor. He has no wheezes.  Abdominal: Soft. There is no tenderness. There is no guarding.  Neurological: He is alert. GCS eye subscore is 4. GCS verbal subscore is 5. GCS motor subscore is 6.  Skin: Skin is warm and dry.  Psychiatric: He has a normal mood and affect.  Nursing note and vitals  reviewed.    ED Treatments / Results  Labs (all labs ordered are listed, but only abnormal results are displayed) Labs Reviewed - No data to display  EKG None  Radiology No results found.  Procedures Procedures (including critical care time)  Medications Ordered in ED Medications - No data to display   Initial Impression / Assessment and Plan / ED Course  I have reviewed the triage vital signs and the nursing notes.  Pertinent labs & imaging results that were available during my care of the patient were reviewed by me and considered in my medical decision making (see chart for details).     Final Clinical Impressions(s) / ED Diagnoses   Final diagnoses:  Medication refill    ED Discharge Orders        Ordered    divalproex (DEPAKOTE) 500 MG DR tablet  2 times daily     07/16/17 1133       Terrilee FilesButler, Ceylon Arenson C, MD 07/18/17 1013

## 2017-09-16 ENCOUNTER — Emergency Department (HOSPITAL_COMMUNITY)
Admission: EM | Admit: 2017-09-16 | Discharge: 2017-09-16 | Disposition: A | Discharge status: Home / Self Care | Payer: Medicare Other | Attending: Emergency Medicine | Admitting: Emergency Medicine

## 2017-09-16 ENCOUNTER — Encounter (HOSPITAL_COMMUNITY): Payer: Self-pay | Admitting: Emergency Medicine

## 2017-09-16 DIAGNOSIS — Z79899 Other long term (current) drug therapy: Secondary | ICD-10-CM | POA: Insufficient documentation

## 2017-09-16 DIAGNOSIS — Z87891 Personal history of nicotine dependence: Secondary | ICD-10-CM | POA: Diagnosis not present

## 2017-09-16 DIAGNOSIS — Z760 Encounter for issue of repeat prescription: Secondary | ICD-10-CM | POA: Insufficient documentation

## 2017-09-16 MED ORDER — DIVALPROEX SODIUM 500 MG PO DR TAB: 500.0000 mg | DELAYED_RELEASE_TABLET | Freq: Two times a day (BID) | ORAL | 0 refills | Status: DC

## 2017-09-16 NOTE — ED Provider Notes (Signed)
[facility] EMERGENCY [facility]PART[facility] Provider Note   CSN: [id number] Arrival date & time: 09/16/17  1351     History   Chief Complaint Chief Complaint  Patient presents with  . Medication Refill    HPI Brandon Guerrero is a 27 y.o. male.  27 yo M with a cc of needed his Depakote refilled.  No seizures.  No other complaints.  Trying to find a PCP, still having difficulty.   The history is provided by the patient.  Medication Refill  Illness  This is a new problem. The current episode started yesterday. The problem occurs constantly. The problem has not changed since onset.Pertinent negatives include no chest pain, no abdominal pain, no headaches and no shortness of breath. Nothing aggravates the symptoms. Nothing relieves the symptoms. He has tried nothing for the symptoms. The treatment provided no relief.    Past Medical History:  Diagnosis Date  . Epilepsy (HCC)   . Seizures [facility](HCC)     Patient Active Problem List   Diagnosis Date Noted  . Dental abscess 03/13/2016  . Seizure (HCC) 08/28/2014  . AKI (acute kidney injury) (HCC) 08/28/2014  . Hypotensive episode 08/28/2014    Past Surgical History:  Procedure Laterality Date  . DEBRIDEMENT MANDIBLE Bilateral 03/13/2016   Procedure: I&D Bilateral  Mandible Fractures;  Surgeon: Ocie DoyneScott Jensen, DDS;  Location: [facility] OR;  Service: Oral Surgery;  Laterality: Bilateral;        Home Medications    Prior to Admission medications   Medication Sig Start Date End Date Taking? Authorizing Provider  acetaminophen (TYLENOL) 325 MG tablet Take 2 tablets (650 mg total) by mouth every 6 (six) hours as needed for mild pain or fever (temp > 101.5). 03/13/16   Jeanella Crazellis, Brandi L, NP  divalproex (DEPAKOTE) 500 MG DR tablet Take 1 tablet (500 mg total) by mouth 2 (two) times daily. 09/16/17   Melene PlanFloyd, Jaxxon Naeem, DO  oxyCODONE-acetaminophen (PERCOCET/ROXICET) 5-325 MG tablet Take 1 tablet by mouth every 4 (four) hours as needed for severe pain.     [provider]    Family History No family history on file.  Social History Social History   Tobacco Use  . Smoking status: Former Smoker    Years: 12.00    Types: Cigarettes  . Smokeless tobacco: Never Used  Substance Use Topics  . Alcohol use: Yes    Comment: "Once every weeks"  . Drug use: No     Allergies   Bee venom and Tomato   Review of Systems Review of Systems  Constitutional: Negative for chills and fever.  HENT: Negative for congestion and facial swelling.   Eyes: Negative for discharge and visual disturbance.  Respiratory: Negative for shortness of breath.   Cardiovascular: Negative for chest pain and palpitations.  Gastrointestinal: Negative for abdominal pain, diarrhea and vomiting.  Musculoskeletal: Negative for arthralgias and myalgias.  Skin: Negative for color change and rash.  Neurological: Negative for tremors, syncope and headaches.  Psychiatric/Behavioral: Negative for confusion and dysphoric mood.     Physical Exam Updated Vital Signs BP (!) 136/96   Pulse 92   Temp 98.7 F (37.1 C) (Oral)   Resp 12   SpO2 99%   Physical Exam  Constitutional: He is oriented to person, place, and time. He appears well-developed and well-nourished.  HENT:  Head: Normocephalic and atraumatic.  Eyes: Pupils are equal, round, and reactive to light. EOM are normal.  Neck: Normal range of motion. Neck supple. No JVD present.  Cardiovascular: Normal rate and regular rhythm. Exam reveals no gallop and no friction rub.  No murmur heard. Pulmonary/Chest: No respiratory distress. He has no wheezes.  Abdominal: He exhibits no distension. There is no tenderness. There is no rebound and no guarding.  Musculoskeletal: Normal range of motion.  Neurological: He is alert and oriented to person, place, and time.  Skin: No rash noted. No pallor.  Psychiatric: He has a normal mood and affect. His behavior is normal.  Nursing note and vitals  reviewed.    ED Treatments / Results  Labs (all labs ordered are listed, but only abnormal results are displayed) Labs Reviewed - No data to display  EKG None  Radiology No results found.  Procedures Procedures (including critical care time)  Medications Ordered in ED Medications - No data to display   Initial Impression / Assessment and Plan / ED Course  I have reviewed the triage vital signs and the nursing notes.  Pertinent labs & imaging results that were available during my care of the patient were reviewed by me and considered in my medical decision making (see chart for details).     27 yo M with a cc of needing his depakote refilled.  No other complaint.  D/c home.   2:55 PM:  I have discussed the diagnosis/risks/treatment options with the patient and believe the pt to be eligible for discharge home to follow-up with PCP. We also discussed returning to the ED immediately if new or worsening sx occur. We discussed the sx which are most concerning (e.g., sudden worsening pain, fever, inability to tolerate by mouth) that necessitate immediate return. Medications administered to the patient during their visit and any new prescriptions provided to the patient are listed below.  Medications given during this visit Medications - No data to display   The patient appears reasonably screen and/or stabilized for discharge and I doubt any other medical condition or other [facility] requiring further screening, evaluation, or treatment in the ED at this time prior to discharge.    Final Clinical Impressions(s) / ED Diagnoses   Final diagnoses:  Encounter for medication refill    ED Discharge Orders         Ordered    divalproex (DEPAKOTE) 500 MG DR tablet  2 times daily     09/16/17 1451    Ambulatory referral to Neurology    Comments:  Seizure disorder without followup   09/16/17 1451           Melene Plan, DO 09/16/17 1455

## 2017-09-16 NOTE — ED Triage Notes (Signed)
Pt states he is unable to find a pcp that accepts his insurance, pt reports he took his last dose of depakote this am and just needs a refill on it. States last seizure was 5 Months ago when he ran out of his medicine.

## 2017-10-17 ENCOUNTER — Emergency Department (HOSPITAL_COMMUNITY)
Admission: EM | Admit: 2017-10-17 | Discharge: 2017-10-17 | Disposition: A | Discharge status: Home / Self Care | Payer: Medicare Other | Attending: Emergency Medicine | Admitting: Emergency Medicine

## 2017-10-17 ENCOUNTER — Other Ambulatory Visit: Payer: Self-pay

## 2017-10-17 ENCOUNTER — Encounter (HOSPITAL_COMMUNITY): Payer: Self-pay | Admitting: Emergency Medicine

## 2017-10-17 DIAGNOSIS — G40909 Epilepsy, unspecified, not intractable, without status epilepticus: Secondary | ICD-10-CM | POA: Insufficient documentation

## 2017-10-17 DIAGNOSIS — Z760 Encounter for issue of repeat prescription: Secondary | ICD-10-CM

## 2017-10-17 DIAGNOSIS — Z87891 Personal history of nicotine dependence: Secondary | ICD-10-CM | POA: Insufficient documentation

## 2017-10-17 MED ORDER — DIVALPROEX SODIUM 500 MG PO DR TAB: 500.0000 mg | DELAYED_RELEASE_TABLET | Freq: Two times a day (BID) | ORAL | 0 refills | Status: DC

## 2017-10-17 NOTE — ED Provider Notes (Signed)
Patient placed in Quick Look pathway, seen and evaluated   Chief Complaint: medication refill  HPI: Brandon Guerrero is a 27 y.o. male who presents to the ED for a medication refill. Patient reports he ran out of his medication and has not been able to get refilled. Pt reports he took his last dose of depakote today and he needs a refill. Pt reports he has been trying to follow up with a PCP but has had issues due to insurance change.   ROS: patient with no complaints other than needing his medication  Physical Exam:  BP 118/90 (BP Location: Right Arm)   Pulse 98   Temp 98.9 F (37.2 C) (Oral)   Resp 17   SpO2 100%    Gen: No distress  Neuro: Awake and Alert  Skin: Warm and dry    Initiation of care has begun. The patient has been counseled on the process, plan, and necessity for staying for the completion/evaluation, and the remainder of the medical screening examination    Janne Napoleon, NP 10/17/17 1635    Shaune Pollack, MD 10/18/17 0004

## 2017-10-17 NOTE — ED Provider Notes (Signed)
[facility] EMERGENCY DEPARTMENT Provider Note   CSN: [id number] Arrival date & time: 10/17/17  1622     History   Chief Complaint Chief Complaint  Patient presents with  . Medication Refill    HPI Brandon Guerrero is a 27 y.o. male here for medication refill for Depakote that he takes for seizure disorder.  Patient states he is taken Depakote 500 mg twice daily for a long time.  His last dose was earlier today and he has no more medication.  His last seizure was last year.  He denies severe headaches, nausea, vomiting, abdominal pain, chest pain.  Offers no other complaints.  He has been unable to follow-up with previous PCP at outside community clinic who initially prescribed him this medication.  He is having insurance issues and has been in to the ER for medication refills for the last 6 months.  No h/o liver disorder.   HPI  Past Medical History:  Diagnosis Date  . Epilepsy (HCC)   . Seizures [facility])     Patient Active Problem List   Diagnosis Date Noted  . Dental abscess 03/13/2016  . Seizure (HCC) 08/28/2014  . AKI (acute kidney injury) (HCC) 08/28/2014  . Hypotensive episode 08/28/2014    Past Surgical History:  Procedure Laterality Date  . DEBRIDEMENT MANDIBLE Bilateral 03/13/2016   Procedure: I&D Bilateral  Mandible Fractures;  Surgeon: Ocie Doyne, DDS;  Location: [facility] OR;  Service: Oral Surgery;  Laterality: Bilateral;        Home Medications    Prior to Admission medications   Medication Sig Start Date End Date Taking? Authorizing Provider  acetaminophen (TYLENOL) 325 MG tablet Take 2 tablets (650 mg total) by mouth every 6 (six) hours as needed for mild pain or fever (temp > 101.5). 03/13/16   Jeanella Craze, NP  divalproex (DEPAKOTE) 500 MG DR tablet Take 1 tablet (500 mg total) by mouth 2 (two) times daily. 10/17/17 12/16/17  Liberty Handy, PA-C  oxyCODONE-acetaminophen (PERCOCET/ROXICET) 5-325 MG tablet Take 1 tablet by mouth every 4  (four) hours as needed for severe pain.    [provider]    Family History History reviewed. No pertinent family history.  Social History Social History   Tobacco Use  . Smoking status: Former Smoker    Years: 12.00    Types: Cigarettes  . Smokeless tobacco: Never Used  Substance Use Topics  . Alcohol use: Yes    Comment: "Once every weeks"  . Drug use: No     Allergies   Bee venom and Tomato   Review of Systems Review of Systems  Neurological: Positive for seizures.  All other systems reviewed and are negative.    Physical Exam Updated Vital Signs BP 118/90 (BP Location: Right Arm)   Pulse 98   Temp 98.9 F (37.2 C) (Oral)   Resp 17   SpO2 100%   Physical Exam  Constitutional: He appears well-developed and well-nourished. No distress.  NAD.  HENT:  Head: Normocephalic and atraumatic.  Right Ear: External ear normal.  Left Ear: External ear normal.  Nose: Nose normal.  Eyes: Conjunctivae and EOM are normal.  Neck: Normal range of motion. Neck supple.  Cardiovascular: Normal rate, regular rhythm and normal heart sounds.  Pulmonary/Chest: Effort normal and breath sounds normal.  Musculoskeletal: Normal range of motion.  Neurological: He is alert.  Skin: Skin is warm and dry. Capillary refill takes less than 2 seconds.  Psychiatric: He has a normal  mood and affect. His behavior is normal. Judgment and thought content normal.  Nursing note and vitals reviewed.    ED Treatments / Results  Labs (all labs ordered are listed, but only abnormal results are displayed) Labs Reviewed - No data to display  EKG None  Radiology No results found.  Procedures Procedures (including critical care time)  Medications Ordered in ED Medications - No data to display   Initial Impression / Assessment and Plan / ED Course  I have reviewed the triage vital signs and the nursing notes.  Pertinent labs & imaging results that were available during my  care of the patient were reviewed by me and considered in my medical decision making (see chart for details).     27 year old with history of seizures here for medication refill for Depakote.  He has been on this medicine for a long time and denies any breakthrough seizures.  Denies adverse effects such as headache, nausea, vomiting, abdominal pain.  No history of liver disorders.  Chart review shows he has been on the same dose for a long time.  Discussed [facility] for further refills.  Discussed return precautions.  Patient is in agreement.  Final Clinical Impressions(s) / ED Diagnoses   Final diagnoses:  Medication refill    ED Discharge Orders         Ordered    divalproex (DEPAKOTE) 500 MG DR tablet  2 times daily     10/17/17 1705           Jerrell Mylar 10/17/17 1712    Benjiman Core, MD 10/18/17 0004

## 2017-10-17 NOTE — Discharge Instructions (Addendum)
Depakote 500 mg twice daily was sent to your pharmacy.   [facility] to establish care with a primary care provider for regular, routine medical care.  This clinic accepts patients without medical insurance. A primary care provider can adjust your daily medications and give you refills.

## 2017-10-17 NOTE — Care Management Note (Signed)
Case Management Note  Patient Details  Name: Brandon Guerrero MRN: [id number] Date of Birth: 08-11-1990  Subjective/Objective:                  need meds refill  Action/Plan: ED CM spoke with the patient at the bedside. Patient states he has Medicaid and "Obama Care" insurance. Reports no issues being able to afford his medication. Patient does not have a PCP to refill his Depakote. Reports he attempted to follow-up with the PCP provided to him during his last ED visit but the office said they do not accept his insurance. CM provided the patient with the telephone number for [facility], [facility] and a list of offices who will possibly accept Medicaid.   Expected Discharge Date:   10/17/17              Expected Discharge Plan:     In-House Referral:     Discharge planning Services  CM Consult, [facility]  Post Acute Care Choice:    Choice offered to:     DME Arranged:    DME Agency:     HH Arranged:    HH Agency:     Status of Service:  Completed, signed off  If discussed at [employer], dates discussed:    Additional Comments:  Antony Haste, RN 10/17/2017, 5:42 PM

## 2017-10-17 NOTE — ED Notes (Signed)
Declined W/C at D/C and was escorted to lobby by RN. 

## 2017-10-17 NOTE — ED Triage Notes (Signed)
CM  To see Pt to help with PCP.

## 2017-10-17 NOTE — ED Triage Notes (Signed)
Pt reports he took his last dose depakote today and he needs a refill. Pt reports he has been trying to follow up with a PCP but has had issues.

## 2017-11-01 ENCOUNTER — Ambulatory Visit: Payer: Medicare Other | Admitting: Podiatry

## 2017-11-27 IMAGING — CR DG CHEST 1V
1 series · 1 of 1 positions shown · non-contrast
Comparison: Chest radiograph dated 08/28/2014.

CLINICAL DATA: 25-year-old male with recent extraction of wisdom to
presenting with fever.

EXAM:
CHEST 1 VIEW

[chest ap]
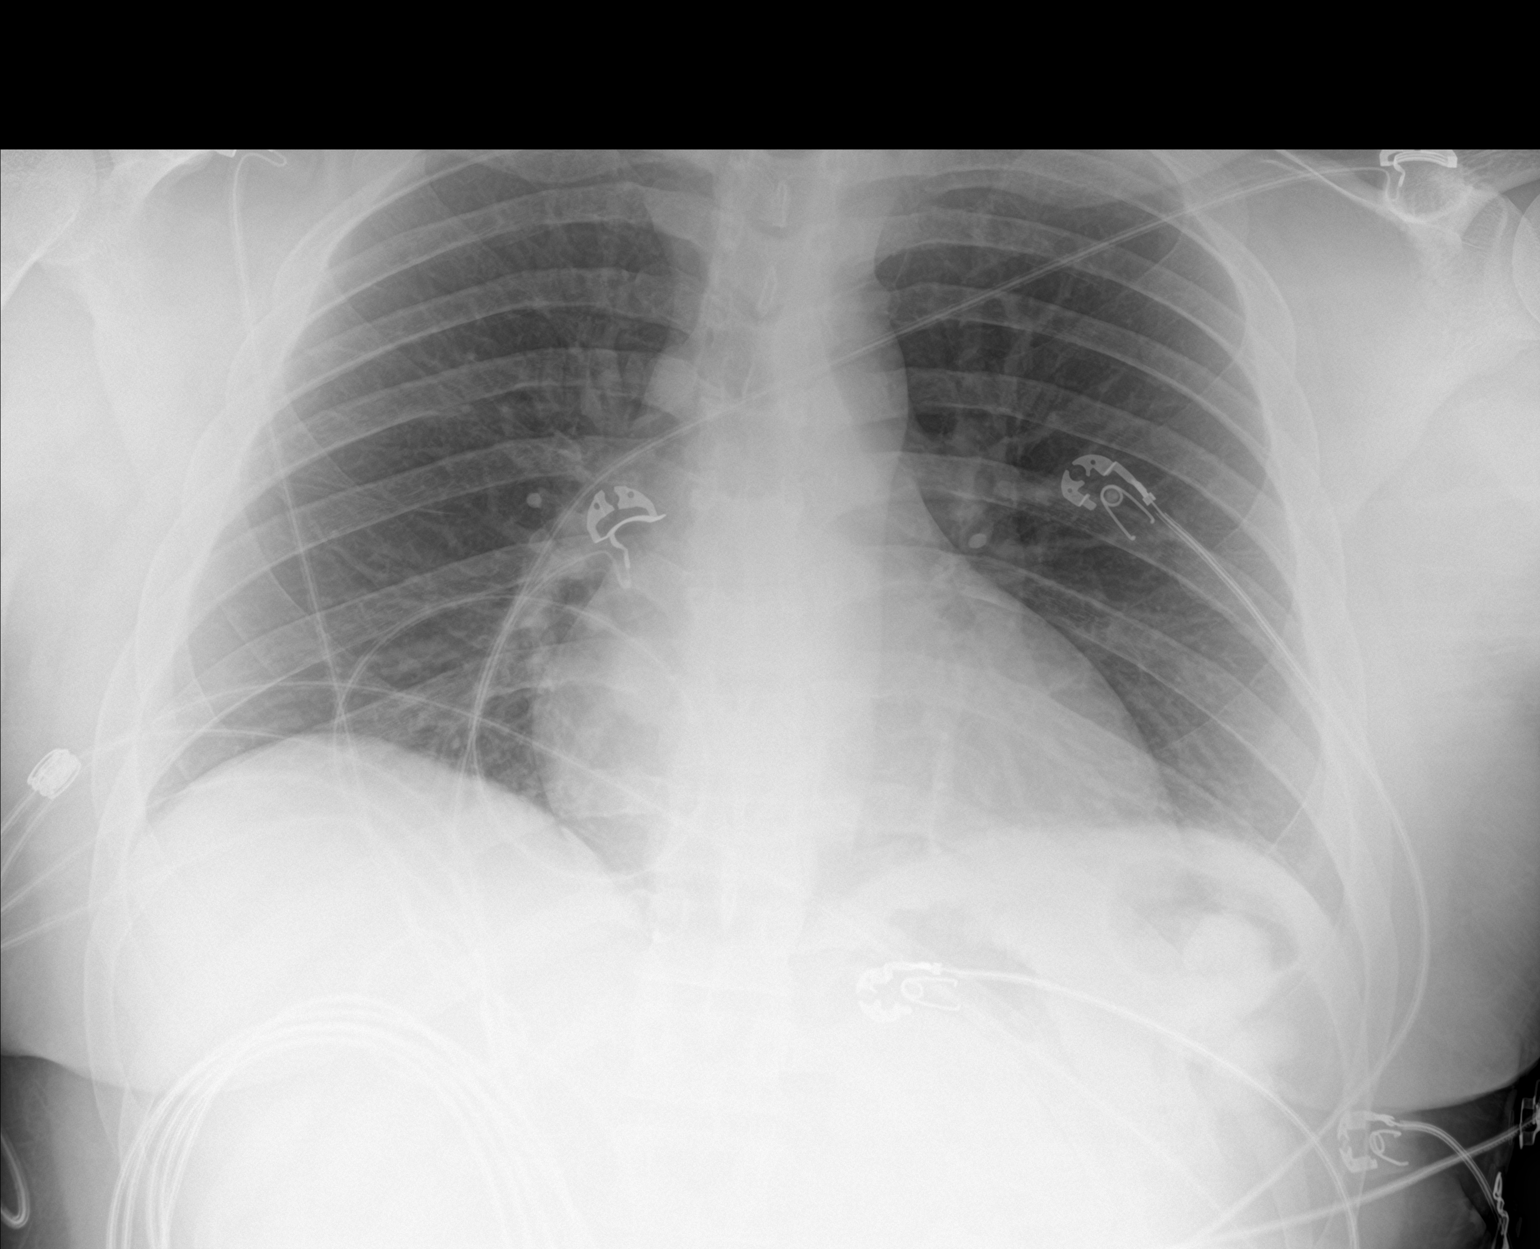

[1 of 1 positions shown; findings below may reference images not displayed]

FINDINGS: The heart size and mediastinal contours are within normal limits.
Both lungs are clear. The visualized skeletal structures are
unremarkable.
IMPRESSION: No active disease.

## 2017-12-16 ENCOUNTER — Encounter (HOSPITAL_COMMUNITY): Payer: Self-pay | Admitting: *Deleted

## 2017-12-16 ENCOUNTER — Emergency Department (HOSPITAL_COMMUNITY)
Admission: EM | Admit: 2017-12-16 | Discharge: 2017-12-16 | Disposition: A | Discharge status: Home / Self Care | Payer: Medicare Other | Attending: Emergency Medicine | Admitting: Emergency Medicine

## 2017-12-16 ENCOUNTER — Other Ambulatory Visit: Payer: Self-pay

## 2017-12-16 DIAGNOSIS — Z87891 Personal history of nicotine dependence: Secondary | ICD-10-CM | POA: Diagnosis not present

## 2017-12-16 DIAGNOSIS — Z760 Encounter for issue of repeat prescription: Secondary | ICD-10-CM

## 2017-12-16 MED ORDER — DIVALPROEX SODIUM 500 MG PO DR TAB: 500.0000 mg | DELAYED_RELEASE_TABLET | Freq: Two times a day (BID) | ORAL | 0 refills | Status: DC

## 2017-12-16 NOTE — ED Triage Notes (Signed)
Call left message with care Man.

## 2017-12-16 NOTE — ED Triage Notes (Signed)
Pt needs Keprs RX refilled . Pt last dose was today amd Pt reports he takes med 2x a day. Pt denies any seizure activity

## 2017-12-16 NOTE — ED Notes (Signed)
Patient verbalizes understanding of discharge instructions. Opportunity for questioning and answers were provided. Armband removed by staff, pt discharged from ED.  

## 2017-12-16 NOTE — ED Notes (Signed)
Pt phone number [phone] is cell phone

## 2017-12-16 NOTE — ED Provider Notes (Signed)
[facility] EMERGENCY DEPARTMENT Provider Note   CSN: [id number] Arrival date & time: 12/16/17  1603     History   Chief Complaint Chief Complaint  Patient presents with  . Medication Refill    HPI Brandon Guerrero is a 27 y.o. male.   Medication Refill  Medications/supplies requested:  Depakote Reason for request:  Medications ran out Medications taken before: yes - see home medications   Patient has complete original prescription information: yes     Past Medical History:  Diagnosis Date  . Epilepsy (HCC)   . Seizures [facility])     Patient Active Problem List   Diagnosis Date Noted  . Dental abscess 03/13/2016  . Seizure (HCC) 08/28/2014  . AKI (acute kidney injury) (HCC) 08/28/2014  . Hypotensive episode 08/28/2014    Past Surgical History:  Procedure Laterality Date  . DEBRIDEMENT MANDIBLE Bilateral 03/13/2016   Procedure: I&D Bilateral  Mandible Fractures;  Surgeon: Ocie Doyne, DDS;  Location: [facility] OR;  Service: Oral Surgery;  Laterality: Bilateral;        Home Medications    Prior to Admission medications   Medication Sig Start Date End Date Taking? Authorizing Provider  acetaminophen (TYLENOL) 325 MG tablet Take 2 tablets (650 mg total) by mouth every 6 (six) hours as needed for mild pain or fever (temp > 101.5). 03/13/16   Jeanella Craze, NP  divalproex (DEPAKOTE) 500 MG DR tablet Take 1 tablet (500 mg total) by mouth 2 (two) times daily. 12/16/17   Margarine Grosshans, Barbara Cower, MD  oxyCODONE-acetaminophen (PERCOCET/ROXICET) 5-325 MG tablet Take 1 tablet by mouth every 4 (four) hours as needed for severe pain.    [provider]    Family History History reviewed. No pertinent family history.  Social History Social History   Tobacco Use  . Smoking status: Former Smoker    Years: 12.00    Types: Cigarettes  . Smokeless tobacco: Never Used  Substance Use Topics  . Alcohol use: Yes    Comment: "Once every weeks"  . Drug use: No      Allergies   Bee venom and Tomato   Review of Systems Review of Systems  All other systems reviewed and are negative.    Physical Exam Updated Vital Signs BP (!) 137/96 (BP Location: Right Arm)   Pulse 75   Temp 98.3 F (36.8 C) (Oral)   Resp 18   Ht 6\' 2"  (1.88 m)   Wt 129.3 kg   SpO2 99%   BMI 36.59 kg/m   Physical Exam  Constitutional: He is oriented to person, place, and time. He appears well-developed and well-nourished.  HENT:  Head: Normocephalic and atraumatic.  Eyes: Conjunctivae and EOM are normal.  Neck: Normal range of motion.  Cardiovascular: Normal rate.  Pulmonary/Chest: Effort normal. No respiratory distress.  Abdominal: Soft. He exhibits no distension.  Musculoskeletal: Normal range of motion.  Neurological: He is alert and oriented to person, place, and time.  Skin: Skin is warm and dry.  Nursing note and vitals reviewed.    ED Treatments / Results  Labs (all labs ordered are listed, but only abnormal results are displayed) Labs Reviewed - No data to display  EKG None  Radiology No results found.  Procedures Procedures (including critical care time)  Medications Ordered in ED Medications - No data to display   Initial Impression / Assessment and Plan / ED Course  I have reviewed the triage vital signs and the nursing notes.  Pertinent  labs & imaging results that were available during my care of the patient were reviewed by me and considered in my medical decision making (see chart for details).     Refilled meds. Consulted care management. List given. Needs to take responsibility.   Final Clinical Impressions(s) / ED Diagnoses   Final diagnoses:  Medication refill    ED Discharge Orders         Ordered    divalproex (DEPAKOTE) 500 MG DR tablet  2 times daily     12/16/17 1627           Mylea Roarty, Barbara CowerJason, MD 12/16/17 2012

## 2017-12-18 NOTE — Care Management Note (Signed)
Case Management Note  CM consulted for no pcp with need to establish care.  CM attempted to call pt x2 and the phone number is not accepting calls at this time.  CM [facility] CM for future possible appointments for TOC until pt can be established with a PCP.  Hopefully a CM can speak with the pt in person during his next ED visit if [facility] is unable to [facility] him, as the same thing happened 2 months ago with a CM trying to reach the pt via phone.  Updated Dr. Clayborne DanaMesner.  No further CM needs noted at this time.  Cheyrl Buley, Lynnae SandhoffAngela N, RN 12/18/2017, 5:04 PM

## 2017-12-19 ENCOUNTER — Telehealth: Payer: Self-pay

## 2017-12-19 NOTE — Telephone Encounter (Signed)
Message received from Brandon AbrahamsAngela Kritzer, RN CM requesting a hospital follow up appointment for the patient.  This CM spoke to him and scheduled an appointment for 12/26/17 @0930  @ [name].   This CM also explained that the clinic [facility] to provide financial counseling and pharmacy services.  Those services are not [facility] at [name] but are available to those patients.  He stated that he understood and was appreciative of the appointment.   Update provided to Brandon CoonsA. Kritzer, RN CM

## 2017-12-26 ENCOUNTER — Ambulatory Visit: Payer: Medicaid Other | Admitting: Family Medicine

## 2018-01-03 ENCOUNTER — Ambulatory Visit: Payer: Medicaid Other | Admitting: Family Medicine

## 2018-01-16 ENCOUNTER — Encounter (HOSPITAL_COMMUNITY): Payer: Self-pay | Admitting: Emergency Medicine

## 2018-01-16 ENCOUNTER — Other Ambulatory Visit: Payer: Self-pay

## 2018-01-16 ENCOUNTER — Emergency Department (HOSPITAL_COMMUNITY)
Admission: EM | Admit: 2018-01-16 | Discharge: 2018-01-16 | Disposition: A | Discharge status: Home / Self Care | Payer: Medicare Other | Attending: Emergency Medicine | Admitting: Emergency Medicine

## 2018-01-16 DIAGNOSIS — Z79899 Other long term (current) drug therapy: Secondary | ICD-10-CM | POA: Insufficient documentation

## 2018-01-16 DIAGNOSIS — Z760 Encounter for issue of repeat prescription: Secondary | ICD-10-CM | POA: Diagnosis present

## 2018-01-16 DIAGNOSIS — Z87891 Personal history of nicotine dependence: Secondary | ICD-10-CM | POA: Insufficient documentation

## 2018-01-16 MED ORDER — DIVALPROEX SODIUM 500 MG PO DR TAB
500.0000 mg | DELAYED_RELEASE_TABLET | Freq: Once | ORAL | Status: AC
  Administered 2018-01-16: 500 mg via ORAL
  Filled 2018-01-16: qty 1

## 2018-01-16 MED ORDER — DIVALPROEX SODIUM 500 MG PO DR TAB: 500.0000 mg | DELAYED_RELEASE_TABLET | Freq: Two times a day (BID) | ORAL | 0 refills | Status: DC

## 2018-01-16 NOTE — ED Triage Notes (Addendum)
Patient requesting Depakote 500mg  refill and resources to get a PCP. Patient denies any complaints and took last dose yesterday.

## 2018-01-16 NOTE — Discharge Instructions (Signed)
You are being provided a courtesy refill of your medication, however this is not what the emergency room is meant to be used for.  It is very important that you establish primary care with a primary care provider.  See your Medicaid card to see if there is a primary care doctor's office listed, if so then go there.  If not, either use the number below to find a primary care doctor, or see the [facility] to establish medical care.  Return to the emergency room for any changes or worsening in symptoms.

## 2018-01-16 NOTE — ED Provider Notes (Signed)
[facility]-EMERGENCY DEPT Provider Note   CSN: [id number] Arrival date & time: 01/16/18  1336     History   Chief Complaint Chief Complaint  Patient presents with  . Medication Refill    HPI Brandon Guerrero is a 27 y.o. male with a PMHx of epilepsy, who presents to the ED for medication refill of his Depakote 500mg  DR that he takes twice daily, last dose was yesterday, he hasn't taken it today because he ran out. He denies having any issues or concerns at this time, denies having any seizures, recent fevers, chills, CP, SOB, abd pain, N/V/D/C, hematuria, dysuria, myalgias, arthralgias, numbness, tingling, focal weakness, or any other complaints at this time. Of note, he has been seen in the ED 7 times since 05/2017 for medication refills, last visit was on 12/16/17. He states he doesn't have a PCP. Reports losing his insurance cards, including his Medicaid card. Isn't sure if there is a doctor's office listed on his medicaid card.   The history is provided by the patient and medical records. No language interpreter was used.  Medication Refill    Past Medical History:  Diagnosis Date  . Epilepsy (HCC)   . Seizures [facility](HCC)     Patient Active Problem List   Diagnosis Date Noted  . Seizure (HCC) 08/28/2014    Past Surgical History:  Procedure Laterality Date  . DEBRIDEMENT MANDIBLE Bilateral 03/13/2016   Procedure: I&D Bilateral  Mandible Fractures;  Surgeon: Ocie DoyneScott Jensen, DDS;  Location: [facility] OR;  Service: Oral Surgery;  Laterality: Bilateral;        Home Medications    Prior to Admission medications   Medication Sig Start Date End Date Taking? Authorizing Provider  divalproex (DEPAKOTE) 500 MG DR tablet Take 1 tablet (500 mg total) by mouth 2 (two) times daily. 12/16/17   Mesner, Barbara CowerJason, MD    Family History No family history on file.  Social History Social History   Tobacco Use  . Smoking status: Former Smoker    Years: 12.00    Types:  Cigarettes  . Smokeless tobacco: Never Used  Substance Use Topics  . Alcohol use: Yes    Comment: "Once every weeks"  . Drug use: No     Allergies   Bee venom and Tomato   Review of Systems Review of Systems  Constitutional: Negative for chills and fever.  Respiratory: Negative for shortness of breath.   Cardiovascular: Negative for chest pain.  Gastrointestinal: Negative for abdominal pain, constipation, diarrhea, nausea and vomiting.  Genitourinary: Negative for dysuria and hematuria.  Musculoskeletal: Negative for arthralgias and myalgias.  Skin: Negative for color change.  Allergic/Immunologic: Negative for immunocompromised state.  Neurological: Negative for seizures, weakness and numbness.  Psychiatric/Behavioral: Negative for confusion.   All other systems reviewed and are negative for acute change except as noted in the HPI.    Physical Exam Updated Vital Signs BP 121/79 (BP Location: Right Arm)   Pulse 99   Temp 98.6 F (37 C) (Oral)   Resp 19   SpO2 100%   Physical Exam Vitals signs and nursing note reviewed.  Constitutional:      General: He is not in acute distress.    Appearance: Normal appearance. He is well-developed. He is not toxic-appearing.     Comments: Afebrile, nontoxic, NAD  HENT:     Head: Normocephalic and atraumatic.  Eyes:     General:        Right eye: No discharge.  Left eye: No discharge.     Conjunctiva/sclera: Conjunctivae normal.  Neck:     Musculoskeletal: Normal range of motion and neck supple.  Cardiovascular:     Rate and Rhythm: Normal rate and regular rhythm.     Pulses: Normal pulses.     Heart sounds: Normal heart sounds, S1 normal and S2 normal. No murmur. No friction rub. No gallop.   Pulmonary:     Effort: Pulmonary effort is normal. No respiratory distress.     Breath sounds: Normal breath sounds. No decreased breath sounds, wheezing, rhonchi or rales.  Abdominal:     General: Bowel sounds are normal. There  is no distension.     Palpations: Abdomen is soft. Abdomen is not rigid.     Tenderness: There is no abdominal tenderness. There is no right CVA tenderness, left CVA tenderness, guarding or rebound. Negative signs include Murphy's sign and McBurney's sign.  Musculoskeletal: Normal range of motion.  Skin:    General: Skin is warm and dry.     Findings: No rash.  Neurological:     Mental Status: He is alert and oriented to person, place, and time.     Sensory: Sensation is intact. No sensory deficit.     Motor: Motor function is intact.  Psychiatric:        Mood and Affect: Mood and affect normal.        Behavior: Behavior normal.      ED Treatments / Results  Labs (all labs ordered are listed, but only abnormal results are displayed) Labs Reviewed - No data to display  EKG None  Radiology No results found.  Procedures Procedures (including critical care time)  Medications Ordered in ED Medications  divalproex (DEPAKOTE) DR tablet 500 mg (500 mg Oral Given 01/16/18 1415)     Initial Impression / Assessment and Plan / ED Course  I have reviewed the triage vital signs and the nursing notes.  Pertinent labs & imaging results that were available during my care of the patient were reviewed by me and considered in my medical decision making (see chart for details).     27 y.o. male here for medication refill.  He does not have a PCP at this time.  He has been seen 7 times in the last 7 months for medication refills through the ER.  He has [place] no medical complaints at this time, physical exam is benign.  Discussed that the emergency room is not the place to come for medication refills, although given the importance of his medication will provide refill today however discussed that this may not be the case in the future and that he must establish primary care.  Resources given. I explained the diagnosis and have given explicit precautions to return to the ER including for any  other new or worsening symptoms. The patient understands and accepts the medical plan as it's been dictated and I have answered their questions. Discharge instructions concerning home care and prescriptions have been given. The patient is STABLE and is discharged to home in good condition.    Final Clinical Impressions(s) / ED Diagnoses   Final diagnoses:  Encounter for medication refill    ED Discharge Orders         Ordered    divalproex (DEPAKOTE) 500 MG DR tablet  2 times daily     01/16/18 [address]1411           Joice Nazario, [place]cedes, [place] 01/16/18 1430    Alvira MondaySchlossman, Erin, MD  01/16/18 2006  

## 2018-02-15 ENCOUNTER — Encounter: Payer: Self-pay | Admitting: Emergency Medicine

## 2018-02-15 ENCOUNTER — Ambulatory Visit
Admission: EM | Admit: 2018-02-15 | Discharge: 2018-02-15 | Disposition: A | Discharge status: Home / Self Care | Payer: Medicare Other | Attending: Emergency Medicine | Admitting: Emergency Medicine

## 2018-02-15 DIAGNOSIS — S0501XA Injury of conjunctiva and corneal abrasion without foreign body, right eye, initial encounter: Secondary | ICD-10-CM

## 2018-02-15 DIAGNOSIS — Z760 Encounter for issue of repeat prescription: Secondary | ICD-10-CM | POA: Diagnosis not present

## 2018-02-15 MED ORDER — DIVALPROEX SODIUM 500 MG PO DR TAB: 500.0000 mg | DELAYED_RELEASE_TABLET | Freq: Two times a day (BID) | ORAL | 0 refills | Status: DC

## 2018-02-15 MED ORDER — FLUORESCEIN SODIUM 1 MG OP STRP
1.0000 | ORAL_STRIP | Freq: Once | OPHTHALMIC | Status: AC
  Administered 2018-02-15: 1 via OPHTHALMIC

## 2018-02-15 MED ORDER — ERYTHROMYCIN 5 MG/GM OP OINT: TOPICAL_OINTMENT | OPHTHALMIC | 0 refills | Status: DC

## 2018-02-15 MED ORDER — TETRACAINE HCL 0.5 % OP SOLN
1.0000 [drp] | Freq: Once | OPHTHALMIC | Status: AC
  Administered 2018-02-15: 1 [drp] via OPHTHALMIC

## 2018-02-15 NOTE — Discharge Instructions (Signed)
I have refilled your Depakote Please go next-door after leaving to establish care with a primary care for further medication refills  Please use erythromycin ointment 4-6 times a day in your right eye to help with discomfort sensation Follow-up if symptoms changing or worsening

## 2018-02-15 NOTE — ED Triage Notes (Signed)
Pt presents to [facility] for refill on his depakote

## 2018-02-15 NOTE — ED Provider Notes (Signed)
[name]    CSN: [id number] Arrival date & time: 02/15/18  1343     History   Chief Complaint Chief Complaint  Patient presents with  . medication refill    HPI Brandon Guerrero is a 28 y.o. male history of seizures presenting today for evaluation of medication refill as well as eye irritation.  Patient states that he took his last dose of Depakote earlier today.  He has been on this medicine for approximately 1 year.  He denies any issues with this.  Patient recently reestablished his insurance and has not had a PCP that has been prescribing this consistently.  He has been going to the emergency room for refills.  He states that his insurance recently went into effect and he plans to get established with a primary care.  He also notes that he has had a foreign body sensation and irritation in his right eye.  States that this often will cause headaches.  Noticed this approximately 1 month ago and symptoms resolved, but over the past couple days he has had the sensation again.  Denies any swelling or any drainage.  Denies contact use.  Denies vision changes.  HPI  Past Medical History:  Diagnosis Date  . Epilepsy (HCC)   . Seizures [facility])     Patient Active Problem List   Diagnosis Date Noted  . Seizure (HCC) 08/28/2014    Past Surgical History:  Procedure Laterality Date  . DEBRIDEMENT MANDIBLE Bilateral 03/13/2016   Procedure: I&D Bilateral  Mandible Fractures;  Surgeon: Ocie Doyne, DDS;  Location: [facility] OR;  Service: Oral Surgery;  Laterality: Bilateral;       Home Medications    Prior to Admission medications   Medication Sig Start Date End Date Taking? Authorizing Provider  divalproex (DEPAKOTE) 500 MG DR tablet Take 1 tablet (500 mg total) by mouth 2 (two) times daily for 30 days. 02/15/18 03/17/18  Balin Vandegrift C, PA-C  erythromycin ophthalmic ointment Place a 1/2 inch ribbon of ointment into the lower eyelid 4-6 times per day 02/15/18   Lew Dawes, PA-C    Family History History reviewed. No pertinent family history.  Social History Social History   Tobacco Use  . Smoking status: Former Smoker    Years: 12.00    Types: Cigarettes  . Smokeless tobacco: Never Used  Substance Use Topics  . Alcohol use: Yes    Comment: "Once every weeks"  . Drug use: No     Allergies   Bee venom and Tomato   Review of Systems Review of Systems  Constitutional: Negative for fatigue and fever.  HENT: Negative for congestion, sinus pressure and sore throat.   Eyes: Negative for photophobia, pain, discharge, itching and visual disturbance.  Respiratory: Negative for cough and shortness of breath.   Cardiovascular: Negative for chest pain.  Gastrointestinal: Negative for abdominal pain, nausea and vomiting.  Genitourinary: Negative for decreased urine volume and hematuria.  Musculoskeletal: Negative for myalgias, neck pain and neck stiffness.  Neurological: Positive for headaches. Negative for dizziness, syncope, facial asymmetry, speech difficulty, weakness, light-headedness and numbness.     Physical Exam Triage Vital Signs ED Triage Vitals  Enc Vitals Group     BP 02/15/18 1353 119/74     Pulse Rate 02/15/18 1353 72     Resp 02/15/18 1353 18     Temp 02/15/18 1353 98.2 F (36.8 C)     Temp Source 02/15/18 1353 Oral     SpO2  02/15/18 1353 95 %     Weight --      Height --      Head Circumference --      Peak Flow --      Pain Score 02/15/18 1355 0     Pain Loc --      Pain Edu? --      Excl. in GC? --    No data found.  Updated Vital Signs BP 119/74   Pulse 72   Temp 98.2 F (36.8 C) (Oral)   Resp 18   SpO2 95%   Visual Acuity Right Eye Distance:   Left Eye Distance:   Bilateral Distance:    Right Eye Near:   Left Eye Near:    Bilateral Near:     Physical Exam Vitals signs and nursing note reviewed.  Constitutional:      Appearance: He is well-developed.  HENT:     Head: Normocephalic and  atraumatic.  Eyes:     Extraocular Movements: Extraocular movements intact.     Conjunctiva/sclera: Conjunctivae normal.     Pupils: Pupils are equal, round, and reactive to light.     Comments: Conjunctive a white, no discharge present on exam, with fluorescein staining small corneal abrasion to approximately 3:00 on the right eye  Neck:     Musculoskeletal: Neck supple.  Cardiovascular:     Rate and Rhythm: Normal rate and regular rhythm.     Heart sounds: No murmur.  Pulmonary:     Effort: Pulmonary effort is normal. No respiratory distress.     Breath sounds: Normal breath sounds.  Abdominal:     Palpations: Abdomen is soft.     Tenderness: There is no abdominal tenderness.  Skin:    General: Skin is warm and dry.  Neurological:     Mental Status: He is alert.      UC Treatments / Results  Labs (all labs ordered are listed, but only abnormal results are displayed) Labs Reviewed - No data to display  EKG None  Radiology No results found.  Procedures Procedures (including critical care time)  Medications Ordered in UC Medications  tetracaine (PONTOCAINE) 0.5 % ophthalmic solution 1 drop (1 drop Right Eye Given by Other 02/15/18 1408)  fluorescein ophthalmic strip 1 strip (1 strip Right Eye Given by Other 02/15/18 1408)    Initial Impression / Assessment and Plan / UC Course  I have reviewed the triage vital signs and the nursing notes.  Pertinent labs & imaging results that were available during my care of the patient were reviewed by me and considered in my medical decision making (see chart for details).     Refilled patient's Depakote, advised that he needs to have a primary care as this medicine typically requires monitoring.  Appears to have corneal abrasion to right eye possibly causing symptoms.  Will give erythromycin ointment to apply topically 4-6 times a day.  Continue to monitor sensation.  Discussed strict return precautions. Patient verbalized  understanding and is agreeable with plan.  Final Clinical Impressions(s) / UC Diagnoses   Final diagnoses:  Medication refill  Abrasion of right cornea, initial encounter     Discharge Instructions     I have refilled your Depakote Please go next-door after leaving to establish care with a primary care for further medication refills  Please use erythromycin ointment 4-6 times a day in your right eye to help with discomfort sensation Follow-up if symptoms changing or worsening   ED Prescriptions  Medication Sig Dispense Auth. Provider   erythromycin ophthalmic ointment Place a 1/2 inch ribbon of ointment into the lower eyelid 4-6 times per day 3.5 g Alphonsa Brickle C, PA-C   divalproex (DEPAKOTE) 500 MG DR tablet  (Status: Discontinued) Take 1 tablet (500 mg total) by mouth 2 (two) times daily for 30 days. 60 tablet Bryndon Cumbie C, PA-C   divalproex (DEPAKOTE) 500 MG DR tablet Take 1 tablet (500 mg total) by mouth 2 (two) times daily for 30 days. 60 tablet Alyzza Andringa, [place] C, PA-C     Controlled Substance Prescriptions [place] Controlled Substance Registry consulted? Not Applicable   Lew Dawes, [place] 02/15/18 1505

## 2018-02-15 NOTE — ED Notes (Signed)
Patient able to ambulate independently  

## 2018-03-12 ENCOUNTER — Ambulatory Visit (INDEPENDENT_AMBULATORY_CARE_PROVIDER_SITE_OTHER): Payer: Medicare Other | Admitting: Family Medicine

## 2018-03-12 ENCOUNTER — Encounter: Payer: Self-pay | Admitting: Family Medicine

## 2018-03-12 VITALS — BP 129/89 | HR 73 | Resp 17 | Ht 71.0 in | Wt 276.0 lb

## 2018-03-12 DIAGNOSIS — R569 Unspecified convulsions: Secondary | ICD-10-CM | POA: Diagnosis not present

## 2018-03-12 DIAGNOSIS — Z7689 Persons encountering health services in other specified circumstances: Secondary | ICD-10-CM

## 2018-03-12 NOTE — Patient Instructions (Addendum)
Thank you for choosing Primary Care at [facility] to be your medical home!    Brandon Guerrero was seen by Joaquin Courts, FNP today.   Brandon Guerrero's primary care provider is Bing Neighbors, FNP.   For the best care possible, you should try to see Joaquin Courts, FNP-C whenever you come to the clinic.   We look forward to seeing you again soon!  If you have any questions about your visit today, please call us at [phone] or feel free to reach your primary care provider via MyChart.     You have been referred to neurology for evaluation of seizure disorder. I have refilled your medication for total of 2 months. You should have a follow-up scheduled with neurology by this time in case they make changes to medication.  If you have a seizure, remember in accordance with [place] Law you must refrain from driving for total of 6 months without seizure activity.  If you have not been contacted by [facility] within 2 weeks,    Seizure, Adult When you have a seizure:  Parts of your body may move.  You may have a change in how aware or awake (conscious) you are.  You may shake (convulse). Seizures usually last from 30 seconds to 2 minutes. Usually, they are not harmful unless they last a long time. What are the signs or symptoms? Common symptoms of this condition include:  Shaking (convulsions).  Stiffness in the body.  Passing out (losing consciousness).  Uncontrolled movements in the: ? Arms or legs. ? Eyes. ? Head. ? Mouth. Some people have symptoms right before a seizure happens. These symptoms may include:  Fear.  Worry (anxiety).  Feeling like you are going to throw up (nausea).  Feeling like the room is spinning (vertigo).  Feeling like you saw or heard something before (dj vu).  Odd tastes or smells.  Changes in vision, such as seeing flashing lights or spots. Follow these instructions at home: Medicines   Take over-the-counter and prescription  medicines only as told by your doctor.  Do not eat or drink anything that may keep your medicine from working, such as alcohol. Activity  Do not do any activities that would be dangerous if you had another seizure, like driving or swimming. Wait until your doctor says it is safe for you to do them.  If you live in the [place] ask your local DMV (department of motor vehicles) when you can drive.  Get plenty of rest. Teaching others   Teach friends and family what to do when you have a seizure. They should: ? Lay you on the ground. ? Protect your head and body. ? Loosen any tight clothing around your neck. ? Turn you on your side. ? Not hold you down. ? Not put anything into your mouth. ? Know whether or not you need emergency care. ? Stay with you until you are better. General instructions  Contact your doctor each time you have a seizure.  Avoid anything that gives you seizures.  Keep a seizure diary. Write down: ? What you think caused each seizure. ? What you remember about each seizure.  Keep all follow-up visits as told by your doctor. This is important. Contact a doctor if:  You have another seizure.  You have seizures more often.  There is any change in what happens during your seizures.  You keep having seizures with treatment.  You have symptoms of being sick or having an  infection. Get help right away if:  You have a seizure: ? That lasts longer than 5 minutes. ? That is different than seizures you had before. ? That makes it harder to breathe. ? After you hurt your head.  After a seizure, you cannot speak or use a part of your body.  After a seizure, you are confused or have a bad headache.  You have two or more seizures in a row.  You are having seizures more often.  You do not wake up right after a seizure.  You get hurt during a seizure. In an emergency:  These symptoms may be an emergency. Do not wait to see if the symptoms will go away.  Get medical help right away. Call your local emergency services (911 in the [place] Do not drive yourself to the hospital. Summary  Seizures usually last from 30 seconds to 2 minutes. Usually, they are not harmful unless they last a long time.  Do not eat or drink anything that may keep your medicine from working, such as alcohol.  Teach friends and family what to do when you have a seizure.  Contact your doctor each time you have a seizure. This information is not intended to replace advice given to you by your health care provider. Make sure you discuss any questions you have with your health care provider. Document Released: 06/22/2007 Document Revised: 09/27/2017 Document Reviewed: 02/09/2017 Elsevier Interactive Patient Education  2019 [employer].

## 2018-03-12 NOTE — Progress Notes (Signed)
Brandon Guerrero, is a 28 y.o. male  PHX:[id number]  AXK:[id number]  DOB - August 16, 1990  CC:  Chief Complaint  Patient presents with  . Establish Care  . Follow-up    UC 1/30: eye irritation, refill of serizure med  . Referral    needs referral to Neuro       HPI: Brandon Guerrero is a 28 y.o. male is here today to establish care.   Brandon Guerrero has Seizure [facility]) on their problem list.   Today's visit:  Diagnosed with seizure disorder over 10 years ago. Reports he was a prior patient at [facility]. No recent follow-up. He has utilized the ER and urgent care for medication.  He reports that he experiences seizure activity several times per month.  Reports previously being prescribed Dilantin and Keppra and he was unable to tolerate either medication.  He reports dose of Depakote is tolerable with minimal side effects.  He denies any use of illicit drugs or heavy alcohol consumption. Patient denies new headaches, chest pain, dizziness, abdominal pain, nausea, new weakness , numbness or tingling, SOB, edema, or worrisome cough.  He denies any other complaints today.  Current medications: Current Outpatient Medications:  .  divalproex (DEPAKOTE) 500 MG DR tablet, Take 1 tablet (500 mg total) by mouth 2 (two) times daily for 30 days., Disp: 60 tablet, Rfl: 0  Pertinent family medical history: No family known history   Allergies  Allergen Reactions  . Bee Venom Swelling  . Tomato Swelling    Social History   Socioeconomic History  . Marital status: Single    Spouse name: Not on file  . Number of children: Not on file  . Years of education: Not on file  . Highest education level: Not on file  Occupational History  . Not on file  Social Needs  . Financial resource strain: Not on file  . Food insecurity:    Worry: Not on file    Inability: Not on file  . Transportation needs:    Medical: Not on file    Non-medical: Not on file  Tobacco Use  . Smoking status: Former Smoker    Years:  12.00    Types: Cigarettes  . Smokeless tobacco: Never Used  Substance and Sexual Activity  . Alcohol use: Yes    Comment: "Once every weeks"  . Drug use: No  . Sexual activity: Not on file  Lifestyle  . Physical activity:    Days per week: Not on file    Minutes per session: Not on file  . Stress: Not on file  Relationships  . Social connections:    Talks on phone: Not on file    Gets together: Not on file    Attends religious service: Not on file    Active member of club or organization: Not on file    Attends meetings of clubs or organizations: Not on file    Relationship status: Not on file  . Intimate partner violence:    Fear of current or ex partner: Not on file    Emotionally abused: Not on file    Physically abused: Not on file    Forced sexual activity: Not on file  Other Topics Concern  . Not on file  Social History Narrative  . Not on file    Review of Systems: Pertinent negatives listed in HPI  Objective:   Vitals:   03/12/18 1008  BP: 129/89  Pulse: 73  Resp: 17  SpO2: 96%  BP Readings from Last 3 Encounters:  03/12/18 129/89  02/15/18 119/74  01/16/18 121/79    Filed Weights   03/12/18 1008  Weight: 276 lb (125.2 kg)      Physical Exam: Constitutional: Patient appears well-developed and well-nourished. No distress. HENT: Normocephalic, atraumatic, External right and left ear normal. Oropharynx is clear and moist.  Eyes: Conjunctivae and EOM are normal. PERRLA, no scleral icterus. Neck: Normal ROM. Neck supple. No JVD. No tracheal deviation. No thyromegaly. CVS: RRR, S1/S2 +, no murmurs, no gallops, no carotid bruit.  Pulmonary: Effort and breath sounds normal, no stridor, rhonchi, wheezes, rales.  Abdominal: Soft. BS +, no distension, tenderness, rebound or guarding.  Musculoskeletal: Normal range of motion. No edema and no tenderness.  Neuro: Alert. Normal muscle tone coordination. Normal gait. BUE and BLE strength 5/5. Bilateral hand  grips symmetrical. Skin: Skin is warm and dry. No rash noted. Not diaphoretic. No erythema. No pallor. Psychiatric: Normal mood and affect. Behavior, judgment, thought content normal.  Lab Results (prior encounters)  Lab Results  Component Value Date   WBC 22.0 (H) 03/13/2016   HGB 12.9 (L) 03/13/2016   HCT 38.0 (L) 03/13/2016   MCV 87.9 03/13/2016   PLT 213 03/13/2016   Lab Results  Component Value Date   CREATININE 1.10 03/13/2016   BUN 7 03/13/2016   NA 139 03/13/2016   K 3.6 03/13/2016   CL 104 03/13/2016   CO2 26 03/13/2016       Assessment and plan:  1. Encounter to establish care 2. Seizure (HCC) -Continue Depakote, until evaluated by urology. Will check a Valproic acid level, cbc, and cmp. Patient advised-Per [employer] [employer], you are restricted from use of an automobile until you have remained seizure free for 6 months. You are being referred to neurology for further evaluation of seizure activity.  Orders Placed This Encounter  Procedures  . Valproic acid level  . CBC with Differential  . Comprehensive metabolic panel  . Ambulatory referral to Neurology    No follow-ups on file.   The patient was given clear instructions to go to ER or return to medical center if symptoms don't improve, worsen or new problems develop. The patient verbalized understanding. The patient was advised  to call and obtain lab results if they haven't heard anything from out office within 7-10 business days.  Joaquin Courts, FNP Primary Care at [facility] [address] [place] [address] [phone]: [phone]    This note has been created with Dragon speech recognition software and Paediatric nurse. Any transcriptional errors are unintentional.

## 2018-03-13 LAB — CBC WITH DIFFERENTIAL/PLATELET
BASOS ABS: 0 10*3/uL (ref 0.0–0.2)
Basos: 1 %
EOS (ABSOLUTE): 0.1 10*3/uL (ref 0.0–0.4)
Eos: 1 %
Hematocrit: 43.2 % (ref 37.5–51.0)
Hemoglobin: 15 g/dL (ref 13.0–17.7)
Immature Grans (Abs): 0 10*3/uL (ref 0.0–0.1)
Immature Granulocytes: 0 %
LYMPHS ABS: 3.4 10*3/uL — AB (ref 0.7–3.1)
Lymphs: 50 %
MCH: 30.1 pg (ref 26.6–33.0)
MCHC: 34.7 g/dL (ref 31.5–35.7)
MCV: 87 fL (ref 79–97)
Monocytes Absolute: 0.4 10*3/uL (ref 0.1–0.9)
Monocytes: 5 %
NEUTROS ABS: 2.9 10*3/uL (ref 1.4–7.0)
Neutrophils: 43 %
PLATELETS: 250 10*3/uL (ref 150–450)
RBC: 4.99 x10E6/uL (ref 4.14–5.80)
RDW: 12.8 % (ref 11.6–15.4)
WBC: 6.8 10*3/uL (ref 3.4–10.8)

## 2018-03-13 LAB — COMPREHENSIVE METABOLIC PANEL
A/G RATIO: 1.7 (ref 1.2–2.2)
ALBUMIN: 4.1 g/dL (ref 4.1–5.2)
ALK PHOS: 75 IU/L (ref 39–117)
ALT: 13 IU/L (ref 0–44)
AST: 22 IU/L (ref 0–40)
BILIRUBIN TOTAL: 0.3 mg/dL (ref 0.0–1.2)
BUN / CREAT RATIO: 7 — AB (ref 9–20)
BUN: 7 mg/dL (ref 6–20)
CHLORIDE: 106 mmol/L (ref 96–106)
CO2: 19 mmol/L — ABNORMAL LOW (ref 20–29)
Calcium: 9.2 mg/dL (ref 8.7–10.2)
Creatinine, Ser: 1.05 mg/dL (ref 0.76–1.27)
GFR calc Af Amer: 111 mL/min/{1.73_m2} (ref >59)
GFR calc non Af Amer: 96 mL/min/{1.73_m2} (ref >59)
GLUCOSE: 104 mg/dL — AB (ref 65–99)
Globulin, Total: 2.4 g/dL (ref 1.5–4.5)
POTASSIUM: 4.2 mmol/L (ref 3.5–5.2)
Sodium: 141 mmol/L (ref 134–144)
Total Protein: 6.5 g/dL (ref 6.0–8.5)

## 2018-03-13 LAB — VALPROIC ACID LEVEL: VALPROIC ACID LVL: 97 ug/mL (ref 50–100)

## 2018-03-15 ENCOUNTER — Encounter: Payer: Self-pay | Admitting: Family Medicine

## 2018-03-16 ENCOUNTER — Other Ambulatory Visit: Payer: Self-pay | Admitting: Family Medicine

## 2018-03-16 ENCOUNTER — Other Ambulatory Visit: Payer: Self-pay

## 2018-03-16 MED ORDER — DIVALPROEX SODIUM 500 MG PO DR TAB: 500.0000 mg | DELAYED_RELEASE_TABLET | Freq: Two times a day (BID) | ORAL | 0 refills | Status: DC

## 2018-03-16 NOTE — Progress Notes (Signed)
Refilled Depakote-no additional refills after today until patient is seen and evaluated by neurology

## 2018-03-16 NOTE — Telephone Encounter (Signed)
Patient states that he needs a refill on the Depakote. Says that he was under the impression that it was sent during his visit but he hasn't received a text from [facility] to pick anything up.  Please advise.  I did give him the number to [facility] to reach out & make his appointment

## 2018-03-16 NOTE — Progress Notes (Signed)
Patient aware that all further refills will need to come from Neurology.

## 2018-03-16 NOTE — Progress Notes (Signed)
Patient notified of results & recommendations. Expressed understanding. Gave patient the number to [facility] so that he can reach out to schedule his appointment.

## 2018-04-05 ENCOUNTER — Other Ambulatory Visit: Payer: Self-pay | Admitting: Family Medicine

## 2018-04-06 ENCOUNTER — Ambulatory Visit
Admission: EM | Admit: 2018-04-06 | Discharge: 2018-04-06 | Disposition: A | Discharge status: Home / Self Care | Payer: Medicare Other | Attending: Family Medicine | Admitting: Family Medicine

## 2018-04-06 DIAGNOSIS — K6289 Other specified diseases of anus and rectum: Secondary | ICD-10-CM

## 2018-04-06 MED ORDER — HYDROCORTISONE 2.5 % RE CREA: 1.0000 "application " | TOPICAL_CREAM | Freq: Two times a day (BID) | RECTAL | 0 refills | Status: DC

## 2018-04-06 MED ORDER — AMOXICILLIN-POT CLAVULANATE 875-125 MG PO TABS: 1.0000 | ORAL_TABLET | Freq: Two times a day (BID) | ORAL | 0 refills | Status: AC

## 2018-04-06 NOTE — ED Provider Notes (Signed)
[name]    CSN: [id number] Arrival date & time: 04/06/18  1052     History   Chief Complaint Chief Complaint  Patient presents with  . Hemorrhoids    HPI Brandon Guerrero is a 28 y.o. male history of epilepsy, seizures presenting today for evaluation of rectal pain.  Patient states that since this morning he has had a lot of discomfort near his rectum.  He feels as if there is not hanging out.  He denies history of hemorrhoids or history of rectal issues.  He has had normal bowel movements.  Denies any blood in stool or with wiping.  Notes that he is daily regular bowel movements and does not have to strain.  Notes that he is lactose intolerant and frequently eats cheese and dairy so his stools are usually looser.  He denies abdominal pain.  Denies nausea or vomiting.  Denies any urinary symptoms of dysuria, penile discharge.  Denies any rashes or lesions in the front genital area.  Denies any leakage or drainage from this area.   HPI  Past Medical History:  Diagnosis Date  . Epilepsy (HCC)   . Seizures [facility])     Patient Active Problem List   Diagnosis Date Noted  . Seizure (HCC) 08/28/2014    Past Surgical History:  Procedure Laterality Date  . DEBRIDEMENT MANDIBLE Bilateral 03/13/2016   Procedure: I&D Bilateral  Mandible Fractures;  Surgeon: Ocie Doyne, DDS;  Location: [facility] OR;  Service: Oral Surgery;  Laterality: Bilateral;       Home Medications    Prior to Admission medications   Medication Sig Start Date End Date Taking? Authorizing Provider  amoxicillin-clavulanate (AUGMENTIN) 875-125 MG tablet Take 1 tablet by mouth every 12 (twelve) hours for 7 days. 04/06/18 04/13/18  Sharone Almond C, PA-C  divalproex (DEPAKOTE) 500 MG DR tablet TAKE 1 TABLET (500 MG TOTAL) BY MOUTH 2 (TWO) TIMES DAILY FOR 30 DAYS. 04/05/18 05/05/18  Bing Neighbors, FNP  hydrocortisone (ANUSOL-HC) 2.5 % rectal cream Place 1 application rectally 2 (two) times daily. 04/06/18    Nadea Kirkland, Junius Creamer, PA-C    Family History No family history on file.  Social History Social History   Tobacco Use  . Smoking status: Former Smoker    Years: 12.00    Types: Cigarettes  . Smokeless tobacco: Never Used  Substance Use Topics  . Alcohol use: Yes    Comment: "Once every weeks"  . Drug use: No     Allergies   Bee venom and Tomato   Review of Systems Review of Systems  Constitutional: Negative for fatigue and fever.  Eyes: Negative for redness, itching and visual disturbance.  Respiratory: Negative for shortness of breath.   Cardiovascular: Negative for chest pain and leg swelling.  Gastrointestinal: Positive for rectal pain. Negative for abdominal pain, blood in stool, constipation, diarrhea, nausea and vomiting.  Musculoskeletal: Negative for arthralgias and myalgias.  Skin: Negative for color change, rash and wound.  Neurological: Negative for dizziness, syncope, weakness, light-headedness and headaches.     Physical Exam Triage Vital Signs ED Triage Vitals [04/06/18 1114]  Enc Vitals Group     BP 115/74     Pulse Rate 72     Resp 20     Temp 97.6 F (36.4 C)     Temp Source Oral     SpO2 96 %     Weight      Height      Head Circumference  Peak Flow      Pain Score 10     Pain Loc      Pain Edu?      Excl. in GC?    No data found.  Updated Vital Signs BP 115/74 (BP Location: Left Arm)   Pulse 72   Temp 97.6 F (36.4 C) (Oral)   Resp 20   SpO2 96%   Visual Acuity Right Eye Distance:   Left Eye Distance:   Bilateral Distance:    Right Eye Near:   Left Eye Near:    Bilateral Near:     Physical Exam Vitals signs and nursing note reviewed.  Constitutional:      Appearance: He is well-developed.     Comments: No acute distress  HENT:     Head: Normocephalic and atraumatic.     Nose: Nose normal.  Eyes:     Conjunctiva/sclera: Conjunctivae normal.  Neck:     Musculoskeletal: Neck supple.  Cardiovascular:     Rate and  Rhythm: Normal rate.  Pulmonary:     Effort: Pulmonary effort is normal. No respiratory distress.  Abdominal:     General: Abdomen is flat. There is no distension.  Genitourinary:    Comments: Perirectal area with small raised bump, overlying skin slightly raw, tender to touch in this area, discomfort with digital rectal exam Musculoskeletal: Normal range of motion.  Skin:    General: Skin is warm and dry.  Neurological:     Mental Status: He is alert and oriented to person, place, and time.      UC Treatments / Results  Labs (all labs ordered are listed, but only abnormal results are displayed) Labs Reviewed - No data to display  EKG None  Radiology No results found.  Procedures Procedures (including critical care time)  Medications Ordered in UC Medications - No data to display  Initial Impression / Assessment and Plan / UC Course  I have reviewed the triage vital signs and the nursing notes.  Pertinent labs & imaging results that were available during my care of the patient were reviewed by me and considered in my medical decision making (see chart for details).     Patient with perirectal lesion, did not appear classic external hemorrhoid, but also did not seem fully suggestive of abscess given raised nature of bump.  Will treat for both today initiating on Augmentin, Anusol HC as well as sitz bath's.  Advised to continue to monitor, follow-up if symptoms worsening or not getting better.Discussed strict return precautions. Patient verbalized understanding and is agreeable with plan.  Final Clinical Impressions(s) / UC Diagnoses   Final diagnoses:  Rectal pain     Discharge Instructions     There is a small bump near your rectum that does not look like a classic hemorroid Please begin Augmentin twice daily in case this is an early abscess Please apply Anusol HC cream twice daily to further help with pain and swelling May try sitting in warm bath water to help  soften this area and help with discomfort Please follow-up if pain worsening, developing increased swelling, drainage, symptoms not resolving with antibiotic and cream.   ED Prescriptions    Medication Sig Dispense Auth. Provider   amoxicillin-clavulanate (AUGMENTIN) 875-125 MG tablet Take 1 tablet by mouth every 12 (twelve) hours for 7 days. 14 tablet Keilana Morlock C, PA-C   hydrocortisone (ANUSOL-HC) 2.5 % rectal cream Place 1 application rectally 2 (two) times daily. 30 g Sy Saintjean, [place] C, [place]  Controlled Substance Prescriptions [place] Controlled Substance Registry consulted? Not Applicable   Lew DawesWieters, Ronisha Herringshaw C, [place] 04/06/18 1223

## 2018-04-06 NOTE — ED Triage Notes (Signed)
Pt c/o pain to rectum, states feels like a knot hanging out.

## 2018-04-06 NOTE — Discharge Instructions (Signed)
There is a small bump near your rectum that does not look like a classic hemorroid Please begin Augmentin twice daily in case this is an early abscess Please apply Anusol HC cream twice daily to further help with pain and swelling May try sitting in warm bath water to help soften this area and help with discomfort Please follow-up if pain worsening, developing increased swelling, drainage, symptoms not resolving with antibiotic and cream.

## 2018-05-02 ENCOUNTER — Other Ambulatory Visit: Payer: Self-pay

## 2018-05-02 ENCOUNTER — Ambulatory Visit (INDEPENDENT_AMBULATORY_CARE_PROVIDER_SITE_OTHER): Payer: Self-pay | Admitting: Neurology

## 2018-05-02 ENCOUNTER — Telehealth: Payer: Self-pay | Admitting: *Deleted

## 2018-05-02 DIAGNOSIS — R569 Unspecified convulsions: Secondary | ICD-10-CM

## 2018-05-02 NOTE — Telephone Encounter (Addendum)
No showed new patient appt.  The patient was scheduled at 1:30 today.  He called our office nearly 30 minutes past his appt time to state he did not have the access code for his visit.  I confirmed his email address (it was correct) and resent the code again.  Dr. Terrace Arabia had also tried to reach him and his brother multiple times at 1:30 but there were no answers to her calls. His appt time was moved to 3:30pm.  At 3:34pm, Dr. Terrace Arabia stated he still had not connected.  I called the patient and he stated his internet was slow but he would try to connect again.  At 3:40pm, he still had not connected. His number was called by Dr.Yan once and myself twice.  He did not answer any of the calls.  We were unable to leave messages.

## 2018-05-02 NOTE — Progress Notes (Addendum)
Fail to reach patient by multiple attempts, leave the message to his brother listed.  Failed again 2nd time with multiple attempts

## 2018-05-08 ENCOUNTER — Ambulatory Visit: Payer: Medicare Other | Admitting: Neurology

## 2018-07-10 ENCOUNTER — Other Ambulatory Visit: Payer: Self-pay | Admitting: Family Medicine

## 2018-08-04 ENCOUNTER — Other Ambulatory Visit: Payer: Self-pay | Admitting: Family Medicine

## 2018-08-06 NOTE — Telephone Encounter (Signed)
No additional refills of antiepileptic medication. Patient has no showed neurology twice.

## 2018-08-12 ENCOUNTER — Other Ambulatory Visit: Payer: Self-pay | Admitting: Family Medicine

## 2018-08-15 ENCOUNTER — Other Ambulatory Visit: Payer: Self-pay

## 2018-08-15 ENCOUNTER — Ambulatory Visit (HOSPITAL_COMMUNITY)
Admission: EM | Admit: 2018-08-15 | Discharge: 2018-08-15 | Disposition: A | Discharge status: Home / Self Care | Payer: Medicare Other | Attending: Urgent Care | Admitting: Urgent Care

## 2018-08-15 ENCOUNTER — Encounter (HOSPITAL_COMMUNITY): Payer: Self-pay | Admitting: Emergency Medicine

## 2018-08-15 DIAGNOSIS — G40909 Epilepsy, unspecified, not intractable, without status epilepticus: Secondary | ICD-10-CM | POA: Diagnosis not present

## 2018-08-15 MED ORDER — DIVALPROEX SODIUM 500 MG PO DR TAB: 500.0000 mg | DELAYED_RELEASE_TABLET | Freq: Two times a day (BID) | ORAL | 0 refills | Status: DC

## 2018-08-15 NOTE — ED Provider Notes (Signed)
  MRN: [id number] DOB: 1990-06-23  Subjective:   Brandon Guerrero is a 28 y.o. male presenting for medication refill of his Depakote.  Patient has a seizure disorder and ran out of his medication today.  He states that he is trying to get back in with his neurologist who is denying refills until he sets up a follow-up.  Counseled patient on need for this and he agrees to contact their clinic for further refills.  Last seizure was about 6 months ago.  He is very compliant with Depakote but not his office visits.  No current facility-administered medications for this encounter.   Current Outpatient Medications:  .  divalproex (DEPAKOTE) 500 MG DR tablet, Take 1 tablet (500 mg total) by mouth 2 (two) times daily for 30 days., Disp: 60 tablet, Rfl: 0 .  hydrocortisone (ANUSOL-HC) 2.5 % rectal cream, Place 1 application rectally 2 (two) times daily., Disp: 30 g, Rfl: 0   Allergies  Allergen Reactions  . Bee Venom Swelling  . Tomato Swelling    Past Medical History:  Diagnosis Date  . Epilepsy ([place])   . Seizures ([place])      Past Surgical History:  Procedure Laterality Date  . DEBRIDEMENT MANDIBLE Bilateral 03/13/2016   Procedure: I&D Bilateral  Mandible Fractures;  Surgeon: Diona Browner, DDS;  Location: [place];  Service: Oral Surgery;  Laterality: Bilateral;    ROS  Objective:   Vitals: BP 119/77 (BP Location: Left Arm)   Pulse 67   Temp 97.9 F (36.6 C) (Temporal)   Resp 16   SpO2 100%   Physical Exam Constitutional:      Appearance: Normal appearance. He is well-developed and normal weight.  HENT:     Head: Normocephalic and atraumatic.     Right Ear: External ear normal.     Left Ear: External ear normal.     Nose: Nose normal.     Mouth/Throat:     Pharynx: Oropharynx is clear.  Eyes:     Extraocular Movements: Extraocular movements intact.     Pupils: Pupils are equal, round, and reactive to light.  Cardiovascular:     Rate and Rhythm: Normal rate.  Pulmonary:   Effort: Pulmonary effort is normal.  Neurological:     General: No focal deficit present.     Mental Status: He is alert and oriented to person, place, and time.     Cranial Nerves: No cranial nerve deficit.     Motor: No weakness.     Coordination: Coordination normal.     Gait: Gait normal.     Deep Tendon Reflexes: Reflexes normal.  Psychiatric:        Mood and Affect: Mood normal.        Behavior: Behavior normal.        Thought Content: Thought content normal.        Judgment: Judgment normal.      Assessment and Plan :   1. Seizure disorder ([place])    Refill provided for 30 days only.  Emphasized need for follow-up with his neurologist. Counseled patient on potential for adverse effects with medications prescribed/recommended today, ER and return-to-clinic precautions discussed, patient verbalized understanding.     Jaynee Eagles, PA-C 08/15/18 1728

## 2018-08-15 NOTE — ED Triage Notes (Signed)
PT ran out of divalproex 2 days ago. Needs refill. Last seizure 6 months ago. PT will schedule neuro appt soon

## 2018-09-20 ENCOUNTER — Encounter: Payer: Self-pay | Admitting: Emergency Medicine

## 2018-09-20 ENCOUNTER — Ambulatory Visit
Admission: EM | Admit: 2018-09-20 | Discharge: 2018-09-20 | Disposition: A | Discharge status: Home / Self Care | Payer: Medicare Other | Attending: Emergency Medicine | Admitting: Emergency Medicine

## 2018-09-20 ENCOUNTER — Other Ambulatory Visit: Payer: Self-pay

## 2018-09-20 DIAGNOSIS — G40909 Epilepsy, unspecified, not intractable, without status epilepticus: Secondary | ICD-10-CM

## 2018-09-20 DIAGNOSIS — Z760 Encounter for issue of repeat prescription: Secondary | ICD-10-CM

## 2018-09-20 MED ORDER — DIVALPROEX SODIUM 500 MG PO DR TAB: 500.0000 mg | DELAYED_RELEASE_TABLET | Freq: Two times a day (BID) | ORAL | 0 refills | Status: DC

## 2018-09-20 NOTE — ED Notes (Signed)
Patient able to ambulate independently  

## 2018-09-20 NOTE — Discharge Instructions (Signed)
Take Depakote as prescribed. You must keep your appointment with your PCP on 10/08/2018 at 2:10 PM, arrive at 1:55 PM. Primary care office will call you the day before to let you know if this will be a virtual visit versus in person appointment.  You will likely need blood work at that time for drug and blood level surveillance. Go to ER for further evaluation if you have a seizure.

## 2018-09-20 NOTE — ED Triage Notes (Signed)
Pt presents to [facility] for refill on his Depakote.

## 2018-09-20 NOTE — ED Provider Notes (Signed)
[name]    CSN: [id number] Arrival date & time: 09/20/18  1430      History   Chief Complaint Chief Complaint  Patient presents with  . Medication Refill    HPI JAICE DIGIOIA is a 28 y.o. male with history of seizures presenting for medication refill.  Patient states he has been out of his Depakote, which he reports compliance with twice daily regimen, for 1 day.  Patient states last seizure was about a year ago, has not had any issues driving.  Chart review done by me during appointment: Patient was to see neurology in April, though had issues with virtual visit and office was unable to contact him.  Patient states he gave updated phone number to front desk at check-in today.    Past Medical History:  Diagnosis Date  . Epilepsy ([place])   . Seizures [facility])     Patient Active Problem List   Diagnosis Date Noted  . Seizure ([place]) 08/28/2014    Past Surgical History:  Procedure Laterality Date  . DEBRIDEMENT MANDIBLE Bilateral 03/13/2016   Procedure: I&D Bilateral  Mandible Fractures;  Surgeon: Diona Browner, DDS;  Location: [facility];  Service: Oral Surgery;  Laterality: Bilateral;       Home Medications    Prior to Admission medications   Medication Sig Start Date End Date Taking? Authorizing Provider  divalproex (DEPAKOTE) 500 MG DR tablet Take 1 tablet (500 mg total) by mouth 2 (two) times daily for 21 days. 09/20/18 10/11/18  Hall-Potvin, [place], PA-C  hydrocortisone (ANUSOL-HC) 2.5 % rectal cream Place 1 application rectally 2 (two) times daily. 04/06/18   Wieters, Elesa Hacker, PA-C    Family History History reviewed. No pertinent family history.  Social History Social History   Tobacco Use  . Smoking status: Former Smoker    Years: 12.00    Types: Cigarettes  . Smokeless tobacco: Never Used  Substance Use Topics  . Alcohol use: Yes    Comment: "Once every weeks"  . Drug use: No     Allergies   Bee venom and Tomato   Review of Systems Review of  Systems  Constitutional: Negative for fatigue and fever.  Respiratory: Negative for cough and shortness of breath.   Cardiovascular: Negative for chest pain and palpitations.  Gastrointestinal: Negative for abdominal pain, diarrhea and vomiting.  Musculoskeletal: Negative for arthralgias and myalgias.  Skin: Negative for rash and wound.  Neurological: Negative for seizures, speech difficulty and headaches.  All other systems reviewed and are negative.    Physical Exam Triage Vital Signs ED Triage Vitals  Enc Vitals Group     BP      Pulse      Resp      Temp      Temp src      SpO2      Weight      Height      Head Circumference      Peak Flow      Pain Score      Pain Loc      Pain Edu?      Excl. in [place]?    No data found.  Updated Vital Signs BP 117/81 (BP Location: Left Arm)   Pulse 77   Temp 98 F (36.7 C) (Oral)   Resp 18   SpO2 96%   Visual Acuity Right Eye Distance:   Left Eye Distance:   Bilateral Distance:    Right Eye Near:  Left Eye Near:    Bilateral Near:     Physical Exam Constitutional:      General: He is not in acute distress. HENT:     Head: Normocephalic and atraumatic.     Mouth/Throat:     Mouth: Mucous membranes are moist.     Pharynx: Oropharynx is clear. No posterior oropharyngeal erythema.  Eyes:     General: No scleral icterus.    Pupils: Pupils are equal, round, and reactive to light.  Neck:     Musculoskeletal: Neck supple. No muscular tenderness.  Cardiovascular:     Rate and Rhythm: Normal rate.     Heart sounds: No murmur. No gallop.   Pulmonary:     Effort: Pulmonary effort is normal. No respiratory distress.     Breath sounds: No wheezing.  Lymphadenopathy:     Cervical: No cervical adenopathy.  Skin:    Coloration: Skin is not jaundiced or pale.  Neurological:     General: No focal deficit present.     Mental Status: He is alert and oriented to person, place, and time.  Psychiatric:        Mood and Affect:  Mood normal.        Behavior: Behavior normal.      UC Treatments / Results  Labs (all labs ordered are listed, but only abnormal results are displayed) Labs Reviewed - No data to display  EKG   Radiology No results found.  Procedures Procedures (including critical care time)  Medications Ordered in UC Medications - No data to display  Initial Impression / Assessment and Plan / UC Course  I have reviewed the triage vital signs and the nursing notes.  Pertinent labs & imaging results that were available during my care of the patient were reviewed by me and considered in my medical decision making (see chart for details).     1.  Medication refill This provider coordinated care: Appointment scheduled PCP for 9/21 at 2:10 PM.  Patient verbalized understanding of appointment, intends to keep it.  Patient declined blood work today, agreeable to doing it a PCP appointment.  Discussed that blood work will need to be done at that time for therapeutic index/metabolic surveillance which is an important part of treatment.  Refill sent for 3-week supply (enough to get him through to PCP appointment, +3 days) patient verbalized understanding.  Return precautions discussed, patient verbalized understanding and is agreeable to plan.  2.  Seizures Patient reported last seizure approximately 1 year ago.  Denies adverse effect to Depakote.  Will not change dosing at this time. Final Clinical Impressions(s) / UC Diagnoses   Final diagnoses:  Encounter for medication refill  Seizure disorder [facility])     Discharge Instructions     Take Depakote as prescribed. You must keep your appointment with your PCP on 10/08/2018 at 2:10 PM, arrive at 1:55 PM. Primary care office will call you the day before to let you know if this will be a virtual visit versus in person appointment.  You will likely need blood work at that time for drug and blood level surveillance. Go to ER for further evaluation if  you have a seizure.    ED Prescriptions    Medication Sig Dispense Auth. Provider   divalproex (DEPAKOTE) 500 MG DR tablet Take 1 tablet (500 mg total) by mouth 2 (two) times daily for 21 days. 42 tablet Hall-Potvin, [place], PA-C     Controlled Substance Prescriptions [place] Controlled Substance Registry consulted?  Not Applicable   Shea EvansHall-Potvin, Brittany, [place] JerseyPA-C 09/20/18 1505

## 2018-10-01 ENCOUNTER — Ambulatory Visit
Admission: EM | Admit: 2018-10-01 | Discharge: 2018-10-01 | Disposition: A | Discharge status: Home / Self Care | Payer: Medicare Other | Attending: Physician Assistant | Admitting: Physician Assistant

## 2018-10-01 ENCOUNTER — Other Ambulatory Visit: Payer: Self-pay

## 2018-10-01 ENCOUNTER — Encounter: Payer: Self-pay | Admitting: Emergency Medicine

## 2018-10-01 DIAGNOSIS — R300 Dysuria: Secondary | ICD-10-CM

## 2018-10-01 MED ORDER — CEFTRIAXONE SODIUM 250 MG IJ SOLR
250.0000 mg | Freq: Once | INTRAMUSCULAR | Status: AC
  Administered 2018-10-01: 250 mg via INTRAMUSCULAR

## 2018-10-01 MED ORDER — AZITHROMYCIN 500 MG PO TABS
1000.0000 mg | ORAL_TABLET | Freq: Once | ORAL | Status: AC
  Administered 2018-10-01: 1000 mg via ORAL

## 2018-10-01 NOTE — Discharge Instructions (Signed)
Rocephin shot and azithromycin given today. No sexual activity for 1 week. Will call with any positive results. Try flonase or Zyrtec over the counter for throat irritation. If develop cough, shortness of breath, or fever, may need Covid testing.

## 2018-10-01 NOTE — ED Provider Notes (Signed)
[name]    CSN: [id number] Arrival date & time: 10/01/18  1111      History   Chief Complaint Chief Complaint  Patient presents with  . Dysuria    HPI Brandon Guerrero is a 28 y.o. male.   28 yo male presents with dysuria x 2 days. He has had unprotected intercourse with 2 partners recently, with last sexual encounter 2 days ago. He reports one partner was tested today and was negative. He denies fever, flank pain, or penile discharge. Denies testicular pain, swelling, or blood in urine. He also reports throat irritation x 1 day that is worse in the morning. He denies cough, nasal congestion, or SOB. He reports a area above his left front tooth that is sore today. Denies jaw or tooth pain. Denies fever, chills, body aches. States has been smoking more THC and wonders if this could be causing symptoms. No sick/COVID contact.      Past Medical History:  Diagnosis Date  . Epilepsy ([facility])   . Seizures [facility](HCC)     Patient Active Problem List   Diagnosis Date Noted  . Seizure ([facility]) 08/28/2014    Past Surgical History:  Procedure Laterality Date  . DEBRIDEMENT MANDIBLE Bilateral 03/13/2016   Procedure: I&D Bilateral  Mandible Fractures;  Surgeon: Ocie DoyneScott Jensen, DDS;  Location[facility];  Service: Oral Surgery;  Laterality: Bilateral;       Home Medications    Prior to Admission medications   Medication Sig Start Date End Date Taking? Authorizing Provider  divalproex (DEPAKOTE) 500 MG DR tablet Take 1 tablet (500 mg total) by mouth 2 (two) times daily for 21 days. 09/20/18 10/11/18  Hall-Potvin, [place], PA-C  hydrocortisone (ANUSOL-HC) 2.5 % rectal cream Place 1 application rectally 2 (two) times daily. 04/06/18   Wieters, Junius CreamerHallie C, PA-C    Family History History reviewed. No pertinent family history.  Social History Social History   Tobacco Use  . Smoking status: Former Smoker    Years: 12.00    Types: Cigarettes  . Smokeless tobacco: Never Used  Substance  Use Topics  . Alcohol use: Yes    Comment: "Once every weeks"  . Drug use: No     Allergies   Bee venom and Tomato   Review of Systems Review of Systems  See HPI.    Physical Exam Triage Vital Signs ED Triage Vitals [10/01/18 1118]  Enc Vitals Group     BP 122/85     Pulse Rate 80     Resp 16     Temp 98.2 F (36.8 C)     Temp Source Oral     SpO2 94 %     Weight      Height      Head Circumference      Peak Flow      Pain Score 5     Pain Loc      Pain Edu?      Excl. in [place]?    No data found.  Updated Vital Signs BP 122/85 (BP Location: Left Arm)   Pulse 80   Temp 98.2 F (36.8 C) (Oral)   Resp 16   SpO2 94%   Physical Exam Exam conducted with a chaperone present.  Constitutional:      General: He is not in acute distress.    Appearance: Normal appearance. He is not ill-appearing or toxic-appearing.  HENT:     Head: Normocephalic and atraumatic.     Nose:  Nose normal. No congestion or rhinorrhea.     Mouth/Throat:     Mouth: Mucous membranes are moist.     Pharynx: Oropharynx is clear. No oropharyngeal exudate or posterior oropharyngeal erythema.  Pulmonary:     Effort: Pulmonary effort is normal.  Genitourinary:    Pubic Area: No rash or pubic lice.      Penis: Normal and circumcised. No erythema, tenderness, discharge, swelling or lesions.      Scrotum/Testes:        Right: Tenderness or swelling not present.        Left: Tenderness or swelling not present.     Epididymis:     Right: Normal.     Left: Normal.  Lymphadenopathy:     Lower Body: No right inguinal adenopathy. No left inguinal adenopathy.  Skin:    General: Skin is warm and dry.  Neurological:     Mental Status: He is alert and oriented to person, place, and time.  Psychiatric:        Mood and Affect: Mood normal.        Behavior: Behavior normal.      UC Treatments / Results  Labs (all labs ordered are listed, but only abnormal results are displayed) Labs Reviewed   CYTOLOGY, (ORAL, ANAL, URETHRAL) ANCILLARY ONLY    EKG   Radiology No results found.  Procedures Procedures (including critical care time)  Medications Ordered in UC Medications  azithromycin (ZITHROMAX) tablet 1,000 mg (1,000 mg Oral Given 10/01/18 1150)  cefTRIAXone (ROCEPHIN) injection 250 mg (250 mg Intramuscular Given 10/01/18 1150)    Initial Impression / Assessment and Plan / UC Course  I have reviewed the triage vital signs and the nursing notes.  Pertinent labs & imaging results that were available during my care of the patient were reviewed by me and considered in my medical decision making (see chart for details).   Urethral swab for culture obtained.Treated for gonorrhea and chlamydia empirically today for dysuria per patient's request. Instructed to abstain from sexual encounters for 1 week.  Patient woke up this morning with throat irritation. Denies other URI symptoms. No COVID contact. He is afebrile without tachycardia, tachypnea. Discussed to monitor for now. Symptomatic treatment discussed. If develop worsening symptoms or develop other URI symptoms, may need COVID testing. Return precautions given.   Final Clinical Impressions(s) / UC Diagnoses   Final diagnoses:  Dysuria   ED Prescriptions    None       Ok Edwards, PA-C 10/01/18 1157

## 2018-10-01 NOTE — ED Triage Notes (Addendum)
Pt presents to [facility] for assessment of 2 days of pain with urination, and sore throat.  Patient states he smokes marijuana out of tobacco cigar wrappers, and has been smoking more than normal.  States sore throat began today.  Patient also states his upper middle gums are "sore" and don't feel right.

## 2018-10-03 ENCOUNTER — Telehealth (HOSPITAL_COMMUNITY): Payer: Self-pay | Admitting: Emergency Medicine

## 2018-10-03 LAB — CYTOLOGY, (ORAL, ANAL, URETHRAL) ANCILLARY ONLY
Chlamydia: POSITIVE — AB
Neisseria Gonorrhea: POSITIVE — AB
Trichomonas: NEGATIVE

## 2018-10-03 NOTE — Telephone Encounter (Signed)
Chlamydia is positive.  This was treated at the urgent care visit with po zithromax 1g.  Pt needs education to please refrain from sexual intercourse for 7 days to give the medicine time to work.  Sexual partners need to be notified and tested/treated.  Condoms may reduce risk of reinfection.  Recheck or followup with PCP for further evaluation if symptoms are not improving.  [facility] notified.  Test for gonorrhea was positive. This was treated at the urgent care visit with IM rocephin 250mg and po zithromax 1g. Pt needs education to refrain from sexual intercourse for 7 days after treatment to give the medicine time to work. Sexual partners need to be notified and tested/treated. Condoms may reduce risk of reinfection. Recheck or followup with PCP for further evaluation if symptoms are not improving. [facility] notified.   Patient contacted and made aware of    results, all questions answered    

## 2018-10-08 ENCOUNTER — Ambulatory Visit (INDEPENDENT_AMBULATORY_CARE_PROVIDER_SITE_OTHER): Payer: Medicare Other | Admitting: Family Medicine

## 2018-10-08 DIAGNOSIS — G40909 Epilepsy, unspecified, not intractable, without status epilepticus: Secondary | ICD-10-CM | POA: Diagnosis not present

## 2018-10-08 MED ORDER — DIVALPROEX SODIUM 500 MG PO DR TAB: 500.0000 mg | DELAYED_RELEASE_TABLET | Freq: Two times a day (BID) | ORAL | 0 refills | Status: DC

## 2018-10-08 NOTE — Progress Notes (Signed)
Virtual Visit via Telephone Note  I connected with Brandon BottcherKail Westhoff on 10/08/18 at  2:10 PM EDT by telephone and verified that I am speaking with the correct person using two identifiers.   I discussed the limitations, risks, security and privacy concerns of performing an evaluation and management service by telephone and the availability of in person appointments. I also discussed with the patient that there may be a patient responsible charge related to this service. The patient expressed understanding and agreed to proceed.  Patient Location: Home Provider Location: [facility] Office Others participating in call: None   History of Present Illness:      28 year old male with a visit that was initially in follow-up of urgent care visit on 10/01/2018 secondary to dysuria.  He reports that his urinary symptoms have resolved.  He is requesting prescription for refill of Depakote for continued treatment of seizure disorder.  He reports that his last seizure was more than a year ago.  He does have loss of consciousness with seizures and occasionally bites his tongue during seizures.  He does not have any other information regarding his seizure disorder.  He is status post emergency department visit on 09/20/2018 in order to obtain a refill of his Depakote.  Patient states that he is compliant with the medication and that he was supposed to be seen by neurology back in April but had difficulty setting up a virtual visit and he is not sure that neurology had his correct phone number to contact him.  Patient did not attempt to call the office later to set up an appointment.  He denies any current issues.  He denies any headaches or dizziness, no changes in vision or hearing, no speech difficulty.  No sore throat or difficulty swallowing.  No shortness of breath or cough, no chest pain or palpitations.  He has had no abdominal pain-no nausea/vomiting/diarrhea or constipation.  No urinary frequency, urgency or  dysuria.   Past Medical History:  Diagnosis Date  . Epilepsy ([facility])   . Seizures ([facility])     Past Surgical History:  Procedure Laterality Date  . DEBRIDEMENT MANDIBLE Bilateral 03/13/2016   Procedure: I&D Bilateral  Mandible Fractures;  Surgeon: Ocie DoyneScott Jensen, DDS;  Location: [facility];  Service: Oral Surgery;  Laterality: Bilateral;    No family history on file.  Social History   Tobacco Use  . Smoking status: Former Smoker    Years: 12.00    Types: Cigarettes  . Smokeless tobacco: Never Used  Substance Use Topics  . Alcohol use: Yes    Comment: "Once every weeks"  . Drug use: No     Allergies  Allergen Reactions  . Bee Venom Swelling  . Tomato Swelling       Observations/Objective: No vital signs or physical exam conducted as visit was done via telephone  Assessment and Plan: 1. Seizure disorder [facility](HCC) Patient with seizure disorder which he reports is been stable for more than a year with no recurrence of seizure during that time.  Patient is provided with a 30-day refill of Depakote and he has made aware that he needs to establish care with neurology to obtain ongoing refills of his medication and for ongoing evaluation and treatment of his seizure disorder.  He reports that he will call neurology today to set up an appointment.  Referral placed in case this is needed by his insurance. - divalproex (DEPAKOTE) 500 MG DR tablet; Take 1 tablet (500 mg total) by mouth 2 (two)  times daily.  Dispense: 60 tablet; Refill: 0 - Ambulatory referral to Neurology  Follow Up Instructions: Follow-up as needed and for yearly well exam    I discussed the assessment and treatment plan with the patient. The patient was provided an opportunity to ask questions and all were answered. The patient agreed with the plan and demonstrated an understanding of the instructions.   The patient was advised to call back or seek an in-person evaluation if the symptoms worsen or if the condition fails to  improve as anticipated.  I provided 8 minutes of non-face-to-face time during this encounter.   Antony Blackbird, MD

## 2018-10-08 NOTE — Progress Notes (Signed)
States that his dysuria has completely resolved.  Does ask for refills on his Depakote. Was referred to [facility] but no-showed appointment. Did ask him to give them a call while waiting on provider to return call so that he can get an appointment. Informed patient that Neurology is who needs to be managing Depakote.

## 2018-10-09 ENCOUNTER — Ambulatory Visit: Payer: Medicare Other | Admitting: Diagnostic Neuroimaging

## 2018-10-11 ENCOUNTER — Telehealth: Payer: Self-pay | Admitting: *Deleted

## 2018-10-11 ENCOUNTER — Ambulatory Visit: Payer: Medicare Other | Admitting: Neurology

## 2018-10-11 ENCOUNTER — Encounter: Payer: Self-pay | Admitting: Neurology

## 2018-10-11 NOTE — Telephone Encounter (Signed)
No showed new patient appointment. 

## 2018-10-23 ENCOUNTER — Ambulatory Visit (INDEPENDENT_AMBULATORY_CARE_PROVIDER_SITE_OTHER): Payer: Medicare Other | Admitting: Podiatry

## 2018-10-23 ENCOUNTER — Other Ambulatory Visit: Payer: Self-pay

## 2018-10-23 ENCOUNTER — Encounter: Payer: Self-pay | Admitting: Podiatry

## 2018-10-23 ENCOUNTER — Ambulatory Visit: Payer: Medicare Other

## 2018-10-23 VITALS — BP 151/84 | HR 69 | Resp 16

## 2018-10-23 DIAGNOSIS — L603 Nail dystrophy: Secondary | ICD-10-CM | POA: Diagnosis not present

## 2018-10-23 DIAGNOSIS — M778 Other enthesopathies, not elsewhere classified: Secondary | ICD-10-CM

## 2018-10-23 DIAGNOSIS — Q828 Other specified congenital malformations of skin: Secondary | ICD-10-CM | POA: Diagnosis not present

## 2018-10-24 ENCOUNTER — Encounter: Payer: Self-pay | Admitting: Podiatry

## 2018-10-24 NOTE — Progress Notes (Signed)
  Subjective:  Patient ID: Brandon Guerrero, male    DOB: [birth year],  MRN: [id number] HPI Chief Complaint  Patient presents with  . Nail Problem    Hallux bilateral - thick and discolored nails x years, starting to get sore, no treatment  . Callouses    Medial hallux left - callused area  . New Patient (Initial Visit)    28 y.o. male presents with the above complaint.   ROS: He denies fever chills nausea vomiting muscle aches pains calf pain back pain chest pain shortness of breath has a history of epilepsy.  Past Medical History:  Diagnosis Date  . Epilepsy ([place])   . Seizures ([place])    Past Surgical History:  Procedure Laterality Date  . DEBRIDEMENT MANDIBLE Bilateral 03/13/2016   Procedure: I&D Bilateral  Mandible Fractures;  Surgeon: Diona Browner, DDS;  Location: [facility];  Service: Oral Surgery;  Laterality: Bilateral;    Current Outpatient Medications:  .  divalproex (DEPAKOTE) 500 MG DR tablet, Take 1 tablet (500 mg total) by mouth 2 (two) times daily., Disp: 60 tablet, Rfl: 0  Allergies  Allergen Reactions  . Bee Venom Swelling  . Tomato Swelling   Review of Systems Objective:   Vitals:   10/23/18 0914  BP: (!) 151/84  Pulse: 69  Resp: 16    General: Well developed, nourished, in no acute distress, alert and oriented x3   Dermatological: Skin is warm, dry and supple bilateral. Nails x 10 are well maintained; remaining integument appears unremarkable at this time. There are no open sores, no preulcerative lesions, no rash or signs of infection present.  Thick yellow dystrophic darkened hallux nail plates bilateral with tinea pedis.  Vascular: Dorsalis Pedis artery and Posterior Tibial artery pedal pulses are 2/4 bilateral with immedate capillary fill time. Pedal hair growth present. No varicosities and no lower extremity edema present bilateral.   Neruologic: Grossly intact via light touch bilateral. Vibratory intact via tuning fork bilateral. Protective threshold with  Semmes Wienstein monofilament intact to all pedal sites bilateral. Patellar and Achilles deep tendon reflexes 2+ bilateral. No Babinski or clonus noted bilateral.   Musculoskeletal: No gross boney pedal deformities bilateral. No pain, crepitus, or limitation noted with foot and ankle range of motion bilateral. Muscular strength 5/5 in all groups tested bilateral.  He has pain on palpation medial calcaneal tubercle of the bilateral heel with pes planus.  Gait: Unassisted, Nonantalgic.    Radiographs:  None taken  Assessment & Plan:   Assessment: Nail dystrophy hallux bilateral mild plantar fasciitis bilateral  Plan: Samples of the nails skin were taken today to be sent for pathologic evaluation.     Ming Mcmannis T. [place], [place]

## 2018-10-30 ENCOUNTER — Encounter: Payer: Self-pay | Admitting: Neurology

## 2018-11-07 ENCOUNTER — Telehealth: Payer: Self-pay | Admitting: Family Medicine

## 2018-11-07 NOTE — Telephone Encounter (Signed)
Denied.  Patient was told at last visit that he needed to see Neurology. I even called & made an appointment for him the same week. He needs to call Neurology & get an appointment sooner than the one he has. He isn't getting any additional refills from Primary Care. We can not manage his seizures.

## 2018-11-07 NOTE — Telephone Encounter (Signed)
1) Medication(s) Requested (by name): divalproex (DEPAKOTE) 500 MG DR tablet [[id number]]   2) Pharmacy of Choice:  [id number] - [place], [address]   Approved medications will be sent to pharmacy, we will reach out to you if there is an issue.  Requests made after 3pm may not be addressed until following business day!

## 2018-11-08 ENCOUNTER — Ambulatory Visit
Admission: EM | Admit: 2018-11-08 | Discharge: 2018-11-08 | Disposition: A | Discharge status: Home / Self Care | Payer: Medicare Other | Source: Home / Self Care

## 2018-11-08 ENCOUNTER — Emergency Department (HOSPITAL_COMMUNITY)
Admission: EM | Admit: 2018-11-08 | Discharge: 2018-11-08 | Disposition: A | Discharge status: Home / Self Care | Payer: Medicare Other | Attending: Emergency Medicine | Admitting: Emergency Medicine

## 2018-11-08 ENCOUNTER — Other Ambulatory Visit (HOSPITAL_BASED_OUTPATIENT_CLINIC_OR_DEPARTMENT_OTHER): Payer: Medicare Other | Admitting: Family Medicine

## 2018-11-08 ENCOUNTER — Other Ambulatory Visit: Payer: Self-pay | Admitting: Family Medicine

## 2018-11-08 ENCOUNTER — Encounter (HOSPITAL_COMMUNITY): Payer: Self-pay | Admitting: Emergency Medicine

## 2018-11-08 ENCOUNTER — Telehealth: Payer: Self-pay | Admitting: Family Medicine

## 2018-11-08 ENCOUNTER — Emergency Department (HOSPITAL_COMMUNITY)
Admission: EM | Admit: 2018-11-08 | Discharge: 2018-11-08 | Discharge status: Against Medical Advice | Payer: Medicare Other

## 2018-11-08 ENCOUNTER — Other Ambulatory Visit: Payer: Self-pay

## 2018-11-08 DIAGNOSIS — G40909 Epilepsy, unspecified, not intractable, without status epilepticus: Secondary | ICD-10-CM

## 2018-11-08 DIAGNOSIS — Z760 Encounter for issue of repeat prescription: Secondary | ICD-10-CM

## 2018-11-08 DIAGNOSIS — Z87891 Personal history of nicotine dependence: Secondary | ICD-10-CM | POA: Diagnosis not present

## 2018-11-08 MED ORDER — DIVALPROEX SODIUM 500 MG PO DR TAB: 500.0000 mg | DELAYED_RELEASE_TABLET | Freq: Two times a day (BID) | ORAL | 0 refills | Status: DC

## 2018-11-08 NOTE — Telephone Encounter (Signed)
Patient came in again to refill medication but we cannot manage his seizures. Patient asked to refer him to a Neurologist since he missed his first one. He has an appointment on 01/02/2019 with [facility], and a new referral should be coming to see if [facility] could possibly see him/get him in sooner.

## 2018-11-08 NOTE — ED Notes (Signed)
Spoke to APP about this patient prior to triage.  Patient seen in September for medication refill of same kind.  Missed appointment with neurologist.  PCP and last [facility] note states in multiple notes patient needs to be monitored and addressed by neurology.  This RN informed patient of the need to follow through with the appropriate levels of care for his condition and levels.  Patient verbalized understanding and will follow through with next referral from PCP.  No visit by provider, will d/c.

## 2018-11-08 NOTE — Telephone Encounter (Signed)
New referral placed.

## 2018-11-08 NOTE — ED Notes (Signed)
Called for Pt No answer 

## 2018-11-08 NOTE — Progress Notes (Signed)
Patient ID: Brandon Guerrero, male   DOB: May 20, 1990, 28 y.o.   MRN: [id number]   28 yo male with seizure disorder and needs new Neurology referral

## 2018-11-08 NOTE — ED Notes (Signed)
Called for pt, no answer

## 2018-11-08 NOTE — ED Provider Notes (Signed)
[facility] DEPT Provider Note   CSN: [id number] Arrival date & time: 11/08/18  1910     History   Chief Complaint Chief Complaint  Patient presents with  . Medication Refill    HPI Brandon Guerrero is a 28 y.o. male.     Patient presents for refill of his depakote prescription. He has been in contact with his primary care provider, who has recommended follow-up with neurology. Notes indicate patient has been a no-show at several neurology appointments, and has been utilizing urgent care for refills. Patient denies any break-through seizures, and reports seizures are controlled on current regimen.  The history is provided by the patient. No language interpreter was used.  Medication Refill Medications/supplies requested:  Depakote Reason for request:  Medications ran out and prescriptions expired Medications taken before: yes - see home medications     Past Medical History:  Diagnosis Date  . Epilepsy ([place])   . Seizures [facility])     Patient Active Problem List   Diagnosis Date Noted  . Seizure ([place]) 08/28/2014    Past Surgical History:  Procedure Laterality Date  . DEBRIDEMENT MANDIBLE Bilateral 03/13/2016   Procedure: I&D Bilateral  Mandible Fractures;  Surgeon: Diona Browner, DDS;  Location: [facility];  Service: Oral Surgery;  Laterality: Bilateral;        Home Medications    Prior to Admission medications   Medication Sig Start Date End Date Taking? Authorizing Provider  divalproex (DEPAKOTE) 500 MG DR tablet Take 1 tablet (500 mg total) by mouth 2 (two) times daily. 11/08/18   Etta Quill, NP    Family History Family History  Problem Relation Age of Onset  . Diabetes Neg Hx   . Heart disease Neg Hx   . Cancer Neg Hx     Social History Social History   Tobacco Use  . Smoking status: Former Smoker    Years: 12.00    Types: Cigarettes  . Smokeless tobacco: Never Used  Substance Use Topics  . Alcohol use: Yes    Comment:  "Once every weeks"  . Drug use: No     Allergies   Bee venom and Tomato   Review of Systems Review of Systems  All other systems reviewed and are negative.    Physical Exam Updated Vital Signs BP 122/71 (BP Location: Left Arm)   Pulse 86   Temp 98.5 F (36.9 C) (Oral)   Resp 16   Ht 6\' 2"  (1.88 m)   Wt 122.5 kg   SpO2 99%   BMI 34.67 kg/m   Physical Exam Vitals signs and nursing note reviewed.  Constitutional:      Appearance: Normal appearance.  HENT:     Head: Normocephalic.     Mouth/Throat:     Mouth: Mucous membranes are moist.  Eyes:     Conjunctiva/sclera: Conjunctivae normal.  Cardiovascular:     Rate and Rhythm: Normal rate and regular rhythm.  Pulmonary:     Effort: Pulmonary effort is normal.     Breath sounds: Normal breath sounds.  Abdominal:     Palpations: Abdomen is soft.  Skin:    General: Skin is warm and dry.  Neurological:     Mental Status: He is alert and oriented to person, place, and time.  Psychiatric:        Mood and Affect: Mood normal.      ED Treatments / Results  Labs (all labs ordered are listed, but only abnormal results are  displayed) Labs Reviewed - No data to display  EKG None  Radiology No results found.  Procedures Procedures (including critical care time)  Medications Ordered in ED Medications - No data to display   Initial Impression / Assessment and Plan / ED Course  I have reviewed the triage vital signs and the nursing notes.  Pertinent labs & imaging results that were available during my care of the patient were reviewed by me and considered in my medical decision making (see chart for details).        Pt here for refill of medication. Medication is not a controlled substance. Will refill medication here. Given one month prescription. Discussed need to follow up with neurology as recommended by PCP for continuation of therapy. He has an appointment scheduled for December. Encouraged patient to  contact neurologist to see if an earlier appointment can be arranged.   Pt is safe for discharge at this time.  Final Clinical Impressions(s) / ED Diagnoses   Final diagnoses:  Medication refill    ED Discharge Orders         Ordered    divalproex (DEPAKOTE) 500 MG DR tablet  2 times daily     11/08/18 2044           Felicie Morn, NP 11/08/18 2309    Charlynne Pander, MD 11/13/18 [phone]

## 2018-11-08 NOTE — ED Triage Notes (Signed)
Patient requested refill of seizure medication.

## 2018-11-08 NOTE — ED Notes (Signed)
Called pt, no answer.

## 2018-11-08 NOTE — Telephone Encounter (Signed)
Dr Chapman Fitch can you placed a new referral for [facility] patient    Patient no showed his new patient appointment with Neurologist. We cannot manage his seizures and he needs a new neurologist.

## 2018-11-08 NOTE — Telephone Encounter (Signed)
Patient no showed his new patient appointment with Neurologist. We cannot manage his seizures and he needs a new neurologist.   Please reach out to patient

## 2018-11-09 NOTE — Telephone Encounter (Signed)
Referral sent to [facility]  . Thank you

## 2018-11-27 ENCOUNTER — Ambulatory Visit: Payer: Medicare Other | Admitting: Podiatry

## 2018-12-14 ENCOUNTER — Emergency Department (HOSPITAL_COMMUNITY)
Admission: EM | Admit: 2018-12-14 | Discharge: 2018-12-14 | Disposition: A | Discharge status: Home / Self Care | Payer: Medicare Other | Attending: Emergency Medicine | Admitting: Emergency Medicine

## 2018-12-14 ENCOUNTER — Other Ambulatory Visit: Payer: Self-pay

## 2018-12-14 DIAGNOSIS — Z87891 Personal history of nicotine dependence: Secondary | ICD-10-CM | POA: Diagnosis not present

## 2018-12-14 DIAGNOSIS — Z760 Encounter for issue of repeat prescription: Secondary | ICD-10-CM | POA: Diagnosis not present

## 2018-12-14 MED ORDER — DIVALPROEX SODIUM 500 MG PO DR TAB: 500.0000 mg | DELAYED_RELEASE_TABLET | Freq: Two times a day (BID) | ORAL | 0 refills | Status: DC

## 2018-12-14 NOTE — ED Notes (Signed)
An After Visit Summary was printed and given to the patient. Discharge instructions given and no further questions at this time.  

## 2018-12-14 NOTE — ED Provider Notes (Signed)
[facility]-EMERGENCY DEPT Provider Note   CSN: [id number] Arrival date & time: 12/14/18  1441     History   Chief Complaint Chief Complaint  Patient presents with  . Medication Refill    HPI Brandon Guerrero is a 28 y.o. male.     HPI   Patient is a 28 year old male with a history of epilepsy, who presents the emergency department today for medication refill.  States that he takes 500 mg of Depakote twice daily and he has 1 pill left.  He was seen in the ED last month for medication refill and at that time was told to follow-up with neurology.  He has tried to reach out to his neurologist but cannot get an appointment until 12/16.  Is requesting refill until he can see his neurologist.  He has had no breakthrough seizures and is asymptomatic at this time.  States he called his PCP who stated that they could not fill his prescription.  Past Medical History:  Diagnosis Date  . Epilepsy ([facility])   . Seizures [facility])     Patient Active Problem List   Diagnosis Date Noted  . Seizure ([facility]) 08/28/2014    Past Surgical History:  Procedure Laterality Date  . DEBRIDEMENT MANDIBLE Bilateral 03/13/2016   Procedure: I&D Bilateral  Mandible Fractures;  Surgeon: Ocie Doyne, DDS;  Location[facility];  Service: Oral Surgery;  Laterality: Bilateral;        Home Medications    Prior to Admission medications   Medication Sig Start Date End Date Taking? Authorizing Provider  divalproex (DEPAKOTE) 500 MG DR tablet Take 1 tablet (500 mg total) by mouth 2 (two) times daily. 12/14/18   Gjon Letarte S, PA-C    Family History Family History  Problem Relation Age of Onset  . Diabetes Neg Hx   . Heart disease Neg Hx   . Cancer Neg Hx     Social History Social History   Tobacco Use  . Smoking status: Former Smoker    Years: 12.00    Types: Cigarettes  . Smokeless tobacco: Never Used  Substance Use Topics  . Alcohol use: Yes    Comment: "Once every weeks"  . Drug  use: No     Allergies   Bee venom and Tomato   Review of Systems Review of Systems  Constitutional: Negative for fever.  Respiratory: Negative for shortness of breath.   Cardiovascular: Negative for chest pain.  Gastrointestinal: Negative for abdominal pain and diarrhea.  Genitourinary: Negative for dysuria.  Musculoskeletal: Negative for back pain.  Neurological: Negative for seizures and headaches.     Physical Exam Updated Vital Signs BP 122/79   Pulse 88   Temp 98.4 F (36.9 C) (Oral)   Resp 18   SpO2 99%   Physical Exam Constitutional:      General: He is not in acute distress.    Appearance: He is well-developed.  Eyes:     Conjunctiva/sclera: Conjunctivae normal.  Neck:     Musculoskeletal: Neck supple.  Cardiovascular:     Rate and Rhythm: Normal rate.  Pulmonary:     Effort: Pulmonary effort is normal.  Neurological:     Mental Status: He is alert and oriented to person, place, and time.      ED Treatments / Results  Labs (all labs ordered are listed, but only abnormal results are displayed) Labs Reviewed - No data to display  EKG None  Radiology No results found.  Procedures Procedures (  including critical care time)  Medications Ordered in ED Medications - No data to display   Initial Impression / Assessment and Plan / ED Course  I have reviewed the triage vital signs and the nursing notes.  Pertinent labs & imaging results that were available during my care of the patient were reviewed by me and considered in my medical decision making (see chart for details).      Final Clinical Impressions(s) / ED Diagnoses   Final diagnoses:  Medication refill   Patient with history of epilepsy here for medication refill of his seizure medication.  Has tried to follow-up with neurology but cannot get an appointment until 12/16 and he is about to run out of his medications.  He has had no breakthrough seizures and is asymptomatic at this time.   Will give Rx for his medication and encourage follow-up with neurology.  Advised to return to the ED for new or worsening symptoms.  ED Discharge Orders         Ordered    divalproex (DEPAKOTE) 500 MG DR tablet  2 times daily     12/14/18 [address], Audery Wassenaar S, PA-C 12/14/18 1634    Pattricia Boss, MD 12/15/18 [phone]

## 2018-12-14 NOTE — ED Triage Notes (Signed)
Pt states he needs a refill for seizure med- Divalproex. Pt states he scheduled an appointment with neurologist, and earliest they could see him was December.

## 2018-12-14 NOTE — Discharge Instructions (Signed)
Continue taking your medications as directed.   Please follow up with your neurologist as scheduled.   Please return to the emergency department for any new or worsening symptoms.

## 2019-01-02 ENCOUNTER — Telehealth (INDEPENDENT_AMBULATORY_CARE_PROVIDER_SITE_OTHER): Payer: Medicare Other | Admitting: Neurology

## 2019-01-02 ENCOUNTER — Other Ambulatory Visit: Payer: Self-pay

## 2019-01-02 ENCOUNTER — Encounter: Payer: Self-pay | Admitting: Neurology

## 2019-01-02 VITALS — Ht 74.0 in | Wt 275.0 lb

## 2019-01-02 DIAGNOSIS — G40309 Generalized idiopathic epilepsy and epileptic syndromes, not intractable, without status epilepticus: Secondary | ICD-10-CM

## 2019-01-02 MED ORDER — DIVALPROEX SODIUM 500 MG PO DR TAB: DELAYED_RELEASE_TABLET | ORAL | 3 refills | Status: DC

## 2019-01-02 NOTE — Progress Notes (Signed)
Virtual Visit via Video Note The purpose of this virtual visit is to provide medical care while limiting exposure to the novel coronavirus.    Consent was obtained for video visit:  Yes.   Answered questions that patient had about telehealth interaction:  Yes.   I discussed the limitations, risks, security and privacy concerns of performing an evaluation and management service by telemedicine. I also discussed with the patient that there may be a patient responsible charge related to this service. The patient expressed understanding and agreed to proceed.  Pt location: Home Physician Location: office Name of referring provider:  Cain Saupe, MD I connected with Brandon Guerrero at patients initiation/request on 01/02/2019 at  9:00 AM EST by video enabled telemedicine application and verified that I am speaking with the correct person using two identifiers. Pt MRN:  [id number] Pt DOB:  December 09, 1990 Video Participants:  Brandon Guerrero   History of Present Illness:  This is a pleasant 28 year old right-handed man presenting to establish care for epilepsy. Seizures started at age 50 or 66, he recalls the first seizure occurred on Christmas eve, no prior warning symptoms, he was witnessed to fall with possible convulsive activity. He had seen neurologist Dr. Marjory Lies and had tests which "could not determine what was causing the seizures." He was started on Levetiracetam which caused him to sleep more and eat less. He asked to switch to a different medication and states he has been on Depakote since then. He was initially having more seizures on the Levetiracetam, but feels that the Depakote has slowed them down. He thinks his last seizure was a year ago. He now gets a warning prior to his seizures, his vision gets kind of black, his mouth gets very dry, he gets sweaty and feels dizzy like he cannot stand straight. He has violent thoughts of doing violent things. He sees his hand shaking a little bit. He  would also feel this way if he misses medication. Seizure triggers include missing medication, sleep deprivation, and alcohol. He has stopped drinking alcohol. He has had nocturnal convulsions. Around 2-3 years ago, he fell out of bed and woke up on the floor from a seizure, injuring his right knee. No focal weakness after. He lives with his niece who has mentioned occasional staring spells, he notices that he also stares off into space for 2-3 minutes. He has occasional myoclonic jerks where he would drop things. He denies any olfactory/gustatory hallucinations, focal numbness/tingling/weakness. He takes the Depakote 500mg  2 tabs every morning because he would tend to forget the afternoon dose. He still feels drowsy and sluggish on the Depakote, with some nausea. He has had more headaches recently, with a tight pulling sensation in the frontal region lasting until he takes Tylenol. No associated vomiting. He would be sensitive to lights. No diplopia, dysarthria/dysphagia, neck pain, bowel/bladder dysfunction. He has a little back pain. He works as a .  Epilepsy Risk Factors:  He had a normal birth and early development.  There is no history of febrile convulsions, CNS infections such as meningitis/encephalitis, significant traumatic brain injury, neurosurgical procedures, or family history of seizures.  Prior AEDs: Levetiracetam Laboratory Data:  Lab Results  Component Value Date        VALPROATE 97 03/12/2018    PAST MEDICAL HISTORY: Past Medical History:  Diagnosis Date  . Epilepsy ([facility])   . Seizures ([facility])     PAST SURGICAL HISTORY: Past Surgical History:  Procedure Laterality Date  .  DEBRIDEMENT MANDIBLE Bilateral 03/13/2016   Procedure: I&D Bilateral  Mandible Fractures;  Surgeon: Diona Browner, DDS;  Location: [facility];  Service: Oral Surgery;  Laterality: Bilateral;    MEDICATIONS: Current Outpatient Medications on File Prior to Visit  Medication Sig Dispense Refill  . divalproex  (DEPAKOTE) 500 MG DR tablet Take 1 tablet (500 mg total) by mouth 2 (two) times daily. 60 tablet 0   No current facility-administered medications on file prior to visit.    ALLERGIES: Allergies  Allergen Reactions  . Bee Venom Swelling  . Tomato Swelling    FAMILY HISTORY: Family History  Problem Relation Age of Onset  . Diabetes Neg Hx   . Heart disease Neg Hx   . Cancer Neg Hx    Outpatient Encounter Medications as of 01/02/2019  Medication Sig  .    . divalproex (DEPAKOTE) 500 MG DR tablet Take 1 tablet (500 mg total) by mouth 2 (two) times daily. (Patient taking 2 tabs every morning)   No facility-administered encounter medications on file as of 01/02/2019.    Observations/Objective:   Vitals:   01/02/19 0827  Weight: 275 lb (124.7 kg)  Height: 6\' 2"  (1.88 m)   GEN:  The patient appears stated age and is in NAD.  Neurological examination: Patient is awake, alert, oriented x 3. No aphasia or dysarthria. Intact fluency and comprehension. Remote and recent memory intact. Able to name and repeat. Cranial nerves: Extraocular movements intact with no nystagmus. No facial asymmetry. Motor: moves all extremities symmetrically, at least anti-gravity x 4. No incoordination on finger to nose testing. Gait: narrow-based and steady, able to tandem walk adequately. Negative Romberg test.   Assessment and Plan:   This is a pleasant 28 year old right-handed man with a history of seizures since age 4 suggestive of a generalized epilepsy. His last convulsion was a year ago. He has some drowsiness on Depakote, we discussed trying to take it at bedtime and monitor if this helps with daytime drowsiness. Refills for Depakote 500mg  2 tabs qhs were sent today. A 1-hour EEG will be ordered to classify his seizures. We discussed that if drowsiness continues or compliance becomes an issue with taking medication in the evening, we will discuss switching to a different AED on his next visit, possibly  Lamotrigine.  [place] driving laws were discussed with the patient, and he knows to stop driving after a seizure, until 6 months seizure-free. Follow-up in 3-4 months, he knows to call for any changes.   Follow Up Instructions:   -I discussed the assessment and treatment plan with the patient. The patient was provided an opportunity to ask questions and all were answered. The patient agreed with the plan and demonstrated an understanding of the instructions.   The patient was advised to call back or seek an in-person evaluation if the symptoms worsen or if the condition fails to improve as anticipated.    Cameron Sprang, MD

## 2019-02-24 ENCOUNTER — Encounter (HOSPITAL_COMMUNITY): Payer: Self-pay

## 2019-02-24 ENCOUNTER — Other Ambulatory Visit: Payer: Self-pay

## 2019-02-24 ENCOUNTER — Emergency Department (HOSPITAL_COMMUNITY)
Admission: EM | Admit: 2019-02-24 | Discharge: 2019-02-24 | Disposition: A | Discharge status: Home / Self Care | Payer: Medicare Other | Attending: Emergency Medicine | Admitting: Emergency Medicine

## 2019-02-24 DIAGNOSIS — Z79899 Other long term (current) drug therapy: Secondary | ICD-10-CM | POA: Insufficient documentation

## 2019-02-24 DIAGNOSIS — M545 Low back pain, unspecified: Secondary | ICD-10-CM

## 2019-02-24 DIAGNOSIS — Z87891 Personal history of nicotine dependence: Secondary | ICD-10-CM | POA: Diagnosis not present

## 2019-02-24 MED ORDER — METHOCARBAMOL 500 MG PO TABS: 500.0000 mg | ORAL_TABLET | Freq: Two times a day (BID) | ORAL | 0 refills | Status: DC

## 2019-02-24 MED ORDER — IBUPROFEN 200 MG PO TABS
600.0000 mg | ORAL_TABLET | Freq: Once | ORAL | Status: AC
  Administered 2019-02-24: 15:00:00 600 mg via ORAL
  Filled 2019-02-24: qty 3

## 2019-02-24 MED ORDER — LIDOCAINE 5 % EX PTCH: 1.0000 | MEDICATED_PATCH | CUTANEOUS | 0 refills | Status: DC

## 2019-02-24 NOTE — ED Triage Notes (Signed)
Pt presents with c/o back pain for approx one week. Pt denies any injury, reports the pain is in his lower back. Pt is ambulatory to triage.

## 2019-02-24 NOTE — Discharge Instructions (Signed)
  Take it easy, but do not lay around too much as this may make any stiffness worse.  Antiinflammatory medications: Take 600 mg of ibuprofen every 6 hours or 440 mg (over the counter dose) to 500 mg (prescription dose) of naproxen every 12 hours for the next 3 days. After this time, these medications may be used as needed for pain. Take these medications with food to avoid upset stomach. Choose only one of these medications, do not take them together. Acetaminophen (generic for Tylenol): Should you continue to have additional pain while taking the ibuprofen or naproxen, you may add in acetaminophen as needed. Your daily total maximum amount of acetaminophen from all sources should be limited to 4000mg/day for persons without liver problems, or 2000mg/day for those with liver problems. Methocarbamol: Methocarbamol (generic for Robaxin) is a muscle relaxer and can help relieve stiff muscles or muscle spasms.  Do not drive or perform other dangerous activities while taking this medication as it can cause drowsiness as well as changes in reaction time and judgement. Lidocaine patches: These are available via either prescription or over-the-counter. The over-the-counter option may be more economical one and are likely just as effective. There are multiple over-the-counter brands, such as Salonpas. Ice: May apply ice to the area over the next 24 hours for 15 minutes at a time to reduce pain, inflammation, and swelling, if present. Exercises: Be sure to perform the attached exercises starting with three times a week and working up to performing them daily. This is an essential part of preventing long term problems.  Follow up: Follow up with a primary care provider for any future management of these complaints. Be sure to follow up within 7-10 days. Return: Return to the ED should symptoms worsen.  For prescription assistance, may try using prescription discount sites or apps, such as goodrx.com 

## 2019-02-24 NOTE — ED Provider Notes (Signed)
[facility] DEPT Provider Note   CSN: [id number] Arrival date & time: 02/24/19  1343     History Chief Complaint  Patient presents with  . Back Pain    Brandon Guerrero is a 29 y.o. male.  HPI     Brandon Guerrero is a 29 y.o. male, with a history of seizures, presenting to the ED with left lower back pain beginning a week ago.  Patient states he woke up with this pain.  He describes it as a soreness/tightness, moderate, nonradiating. He has not tried any therapies for the pain. Denies IV drug use, HIV, immunocompromise. Denies fever/chills, abdominal pain, falls/trauma, urinary complaints, N/V/D, numbness, weakness, changes in bowel or bladder function, saddle anesthesias, or any other complaints.   Past Medical History:  Diagnosis Date  . Epilepsy ([place])   . Seizures [facility])     Patient Active Problem List   Diagnosis Date Noted  . Seizure ([place]) 08/28/2014    Past Surgical History:  Procedure Laterality Date  . DEBRIDEMENT MANDIBLE Bilateral 03/13/2016   Procedure: I&D Bilateral  Mandible Fractures;  Surgeon: Diona Browner, DDS;  Location: [facility];  Service: Oral Surgery;  Laterality: Bilateral;       Family History  Problem Relation Age of Onset  . Diabetes Neg Hx   . Heart disease Neg Hx   . Cancer Neg Hx     Social History   Tobacco Use  . Smoking status: Former Smoker    Years: 12.00    Types: Cigarettes  . Smokeless tobacco: Never Used  Substance Use Topics  . Alcohol use: Yes    Comment: "Once every weeks"  . Drug use: No    Home Medications Prior to Admission medications   Medication Sig Start Date End Date Taking? Authorizing Provider  divalproex (DEPAKOTE) 500 MG DR tablet Take 2 tablets every night 01/02/19   Cameron Sprang, MD  lidocaine (LIDODERM) 5 % Place 1 patch onto the skin daily. Remove & Discard patch within 12 hours or as directed by MD 02/24/19   Shia Eber C, PA-C  methocarbamol (ROBAXIN) 500 MG tablet Take 1  tablet (500 mg total) by mouth 2 (two) times daily. 02/24/19   Kasey Hansell C, PA-C    Allergies    Bee venom and Tomato  Review of Systems   Review of Systems  Constitutional: Negative for chills and fever.  Respiratory: Negative for cough and shortness of breath.   Cardiovascular: Negative for chest pain.  Gastrointestinal: Negative for abdominal pain, diarrhea, nausea and vomiting.  Genitourinary: Negative for dysuria, flank pain, frequency and hematuria.  Musculoskeletal: Positive for back pain.  Neurological: Negative for weakness and numbness.  All other systems reviewed and are negative.   Physical Exam Updated Vital Signs BP (!) 139/99 (BP Location: Right Arm)   Pulse 84   Temp 98 F (36.7 C) (Oral)   Resp 18   SpO2 100%   Physical Exam Vitals and nursing note reviewed.  Constitutional:      General: He is not in acute distress.    Appearance: He is well-developed. He is not diaphoretic.  HENT:     Head: Normocephalic and atraumatic.     Mouth/Throat:     Mouth: Mucous membranes are moist.     Pharynx: Oropharynx is clear.  Eyes:     Conjunctiva/sclera: Conjunctivae normal.  Cardiovascular:     Rate and Rhythm: Normal rate and regular rhythm.     Pulses: Normal  pulses.  Pulmonary:     Effort: Pulmonary effort is normal. No respiratory distress.  Abdominal:     Palpations: Abdomen is soft.     Tenderness: There is no abdominal tenderness. There is no right CVA tenderness, left CVA tenderness or guarding.  Musculoskeletal:     Cervical back: Neck supple.       Back:     Right lower leg: No edema.     Left lower leg: No edema.  Lymphadenopathy:     Cervical: No cervical adenopathy.  Skin:    General: Skin is warm and dry.  Neurological:     Mental Status: He is alert.     Comments: Sensation grossly intact to light touch in the lower extremities bilaterally. No saddle anesthesias. Strength 5/5 in the bilateral lower extremities. No noted gait  deficit. Coordination intact.  Psychiatric:        Mood and Affect: Mood and affect normal.        Speech: Speech normal.        Behavior: Behavior normal.     ED Results / Procedures / Treatments   Labs (all labs ordered are listed, but only abnormal results are displayed) Labs Reviewed - No data to display  EKG None  Radiology No results found.  Procedures Procedures (including critical care time)  Medications Ordered in ED Medications  ibuprofen (ADVIL) tablet 600 mg (600 mg Oral Given 02/24/19 1510)    ED Course  I have reviewed the triage vital signs and the nursing notes.  Pertinent labs & imaging results that were available during my care of the patient were reviewed by me and considered in my medical decision making (see chart for details).    MDM Rules/Calculators/A&P                       Patient presents with atraumatic lower left back pain. Patient is nontoxic appearing, afebrile, not tachycardic, not tachypneic, not hypotensive, excellent SPO2 on room air, and is in no apparent distress.  No focal neuro deficits. The patient was given instructions for home care as well as return precautions. Patient voices understanding of these instructions, accepts the plan, and is comfortable with discharge.   Final Clinical Impression(s) / ED Diagnoses Final diagnoses:  Acute left-sided low back pain without sciatica    Rx / DC Orders ED Discharge Orders         Ordered    methocarbamol (ROBAXIN) 500 MG tablet  2 times daily     02/24/19 1501    lidocaine (LIDODERM) 5 %  Every 24 hours     02/24/19 1501           Concepcion Living 02/24/19 1536    Wynetta Fines, MD 03/01/19 1413

## 2019-04-10 ENCOUNTER — Ambulatory Visit: Payer: Medicare Other | Admitting: Neurology

## 2019-10-11 ENCOUNTER — Other Ambulatory Visit: Payer: Medicare Other

## 2019-10-11 DIAGNOSIS — Z20822 Contact with and (suspected) exposure to covid-19: Secondary | ICD-10-CM

## 2019-10-12 LAB — NOVEL CORONAVIRUS, NAA: SARS-CoV-2, NAA: NOT DETECTED

## 2019-10-12 LAB — SARS-COV-2, NAA 2 DAY TAT

## 2019-10-29 ENCOUNTER — Ambulatory Visit
Admission: EM | Admit: 2019-10-29 | Discharge: 2019-10-29 | Disposition: A | Discharge status: Home / Self Care | Payer: Medicare Other | Attending: Family Medicine | Admitting: Family Medicine

## 2019-10-29 ENCOUNTER — Other Ambulatory Visit: Payer: Self-pay

## 2019-10-29 DIAGNOSIS — M542 Cervicalgia: Secondary | ICD-10-CM

## 2019-10-29 MED ORDER — MELOXICAM 7.5 MG PO TABS: 15.0000 mg | ORAL_TABLET | Freq: Every day | ORAL | 0 refills | Status: DC

## 2019-10-29 MED ORDER — TIZANIDINE HCL 4 MG PO TABS: 4.0000 mg | ORAL_TABLET | Freq: Two times a day (BID) | ORAL | 0 refills | Status: DC | PRN

## 2019-10-29 NOTE — Discharge Instructions (Signed)
Take medication as prescribed.  If symptoms do not improve with treatment follow-up with Ortho provider listed on your discharge paperwork.

## 2019-10-29 NOTE — ED Notes (Signed)
No answer, message left

## 2019-10-29 NOTE — ED Provider Notes (Signed)
[name]    CSN: [id number] Has not date & time: 10/29/19  1258      History   Chief Complaint Chief Complaint  Patient presents with  . Neck Pain    HPI Brandon Guerrero is a 29 y.o. male.   HPI  Complains of neck pain starting about a week ago initially had difficulty performing ranges of motion at acute onset of neck pain.  He took some over-the-counter medication and is now able to rotate his neck however with lying down and certain movements causes radiating pain from the neck over to his right  shoulder and down his back.    Past Medical History:  Diagnosis Date  . Epilepsy (HCC)   . Seizures [facility])     Patient Active Problem List   Diagnosis Date Noted  . Seizure (HCC) 08/28/2014    Past Surgical History:  Procedure Laterality Date  . DEBRIDEMENT MANDIBLE Bilateral 03/13/2016   Procedure: I&D Bilateral  Mandible Fractures;  Surgeon: Ocie Doyne, DDS;  Location: [facility] OR;  Service: Oral Surgery;  Laterality: Bilateral;       Home Medications    Prior to Admission medications   Medication Sig Start Date End Date Taking? Authorizing Provider  divalproex (DEPAKOTE) 500 MG DR tablet Take 2 tablets every night 01/02/19   Van Clines, MD    Family History Family History  Problem Relation Age of Onset  . Diabetes Neg Hx   . Heart disease Neg Hx   . Cancer Neg Hx     Social History Social History   Tobacco Use  . Smoking status: Former Smoker    Years: 12.00    Types: Cigarettes  . Smokeless tobacco: Never Used  Vaping Use  . Vaping Use: Never used  Substance Use Topics  . Alcohol use: Yes    Comment: "Once every weeks"  . Drug use: No     Allergies   Bee venom and Tomato   Review of Systems Review of Systems Pertinent negatives listed in HPI   Physical Exam Triage Vital Signs ED Triage Vitals [10/29/19 1604]  Enc Vitals Group     BP 116/79     Pulse Rate 74     Resp 18     Temp 98.3 F (36.8 C)     Temp Source  Oral     SpO2 96 %     Weight      Height      Head Circumference      Peak Flow      Pain Score 9     Pain Loc      Pain Edu?      Excl. in GC?    No data found.  Updated Vital Signs BP 116/79 (BP Location: Left Arm)   Pulse 74   Temp 98.3 F (36.8 C) (Oral)   Resp 18   SpO2 96%   Visual Acuity Right Eye Distance:   Left Eye Distance:   Bilateral Distance:    Right Eye Near:   Left Eye Near:    Bilateral Near:     Physical Exam HENT:     Head: Normocephalic.  Cardiovascular:     Rate and Rhythm: Normal rate and regular rhythm.  Pulmonary:     Effort: Pulmonary effort is normal.     Breath sounds: Normal breath sounds and air entry.  Musculoskeletal:     Cervical back: Normal range of motion and neck supple. No rigidity. Muscular  tenderness present. No spinous process tenderness. Normal range of motion.  Psychiatric:        Attention and Perception: Attention normal.        Mood and Affect: Mood normal.      UC Treatments / Results  Labs (all labs ordered are listed, but only abnormal results are displayed) Labs Reviewed - No data to display  EKG   Radiology No results found.  Procedures Procedures (including critical care time)  Medications Ordered in UC Medications - No data to display  Initial Impression / Assessment and Plan / UC Course  I have reviewed the triage vital signs and the nursing notes.  Pertinent labs & imaging results that were available during my care of the patient were reviewed by me and considered in my medical decision making (see chart for details).    Exam are unremarkable. Work note provided. Patient has full ROM without difficulty. Trial Meloxicam and tizanidine. No imaging warrant,patient has full ROM.  Follow-up with orthopedics if pain worsens or doesn't resolve.. Final Clinical Impressions(s) / UC Diagnoses   Final diagnoses:  Cervical pain (neck)     Discharge Instructions     Take medication as  prescribed.  If symptoms do not improve with treatment follow-up with Ortho provider listed on your discharge paperwork.    ED Prescriptions    Medication Sig Dispense Auth. Provider   tiZANidine (ZANAFLEX) 4 MG tablet Take 1 tablet (4 mg total) by mouth 2 (two) times daily as needed for muscle spasms. 30 tablet Bing Neighbors, FNP   meloxicam (MOBIC) 7.5 MG tablet Take 2 tablets (15 mg total) by mouth daily. 30 tablet Bing Neighbors, FNP     PDMP not reviewed this encounter.   Bing Neighbors, FNP 10/29/19 2322

## 2019-10-29 NOTE — ED Triage Notes (Signed)
Pt c/o neck pain radiating to both arms and lower back pain x1wk. States started with a crick in neck but has worsen. Denies known injury.

## 2019-11-13 ENCOUNTER — Telehealth: Payer: Medicare Other

## 2020-01-01 ENCOUNTER — Encounter: Payer: Medicare Other | Admitting: Family

## 2020-01-01 NOTE — Progress Notes (Signed)
Patient did not show for appointment.   

## 2020-01-16 ENCOUNTER — Other Ambulatory Visit: Payer: Self-pay

## 2020-01-16 ENCOUNTER — Ambulatory Visit (HOSPITAL_COMMUNITY)
Admission: EM | Admit: 2020-01-16 | Discharge: 2020-01-16 | Disposition: A | Discharge status: Home / Self Care | Payer: Medicare Other | Attending: Family Medicine | Admitting: Family Medicine

## 2020-01-16 DIAGNOSIS — Z20822 Contact with and (suspected) exposure to covid-19: Secondary | ICD-10-CM

## 2020-01-16 LAB — SARS CORONAVIRUS 2 (TAT 6-24 HRS): SARS Coronavirus 2: NEGATIVE

## 2020-01-16 NOTE — ED Triage Notes (Signed)
Patient presents for COVID-19 testing.   Patient denies any symptoms or complaints today.

## 2020-01-22 ENCOUNTER — Encounter: Payer: Self-pay | Admitting: Family

## 2020-01-22 ENCOUNTER — Other Ambulatory Visit: Payer: Self-pay

## 2020-01-22 ENCOUNTER — Ambulatory Visit (INDEPENDENT_AMBULATORY_CARE_PROVIDER_SITE_OTHER): Payer: Medicare Other | Admitting: Family

## 2020-01-22 VITALS — BP 114/78 | HR 94 | Temp 97.2°F | Resp 20 | Ht 74.0 in | Wt 260.2 lb

## 2020-01-22 DIAGNOSIS — Z7689 Persons encountering health services in other specified circumstances: Secondary | ICD-10-CM | POA: Diagnosis not present

## 2020-01-22 DIAGNOSIS — M5441 Lumbago with sciatica, right side: Secondary | ICD-10-CM

## 2020-01-22 DIAGNOSIS — Z2821 Immunization not carried out because of patient refusal: Secondary | ICD-10-CM | POA: Diagnosis not present

## 2020-01-22 DIAGNOSIS — M5442 Lumbago with sciatica, left side: Secondary | ICD-10-CM | POA: Diagnosis not present

## 2020-01-22 NOTE — Patient Instructions (Addendum)
Return in 4 to 6 weeks or sooner if needed for annual physical examination, labs, and health maintenance. Arrive fasting meaning having had no food and/or nothing to drink for at least 8 hours prior to appointment.  Naproxen for back pain.  Cyclobenzaprine for muscle spasms of back.   Referral to Chriopractor for back pain. Thank you for choosing Primary Care at [facility] for your medical home!    Brandon Guerrero was seen by Rema Fendt, NP today.   Brandon Guerrero's primary care provider is Rema Fendt, NP.   For the best care possible,  you should try to see Ricky Stabs, NP whenever you come to clinic.   We look forward to seeing you again soon!  If you have any questions about your visit today,  please call us at [phone]  Or feel free to reach your provider via MyChart.     Acute Back Pain, Adult Acute back pain is sudden and usually short-lived. It is often caused by an injury to the muscles and tissues in the back. The injury may result from:  A muscle or ligament getting overstretched or torn (strained). Ligaments are tissues that connect bones to each other. Lifting something improperly can cause a back strain.  Wear and tear (degeneration) of the spinal disks. Spinal disks are circular tissue that provides cushioning between the bones of the spine (vertebrae).  Twisting motions, such as while playing sports or doing yard work.  A hit to the back.  Arthritis. You may have a physical exam, lab tests, and imaging tests to find the cause of your pain. Acute back pain usually goes away with rest and home care. Follow these instructions at home: Managing pain, stiffness, and swelling  Take over-the-counter and prescription medicines only as told by your health care provider.  Your health care provider may recommend applying ice during the first 24-48 hours after your pain starts. To do this: ? Put ice in a plastic bag. ? Place a towel between your skin and  the bag. ? Leave the ice on for 20 minutes, 2-3 times a day.  If directed, apply heat to the affected area as often as told by your health care provider. Use the heat source that your health care provider recommends, such as a moist heat pack or a heating pad. ? Place a towel between your skin and the heat source. ? Leave the heat on for 20-30 minutes. ? Remove the heat if your skin turns bright red. This is especially important if you are unable to feel pain, heat, or cold. You have a greater risk of getting burned. Activity   Do not stay in bed. Staying in bed for more than 1-2 days can delay your recovery.  Sit up and stand up straight. Avoid leaning forward when you sit, or hunching over when you stand. ? If you work at a desk, sit close to it so you do not need to lean over. Keep your chin tucked in. Keep your neck drawn back, and keep your elbows bent at a right angle. Your arms should look like the letter "L." ? Sit high and close to the steering wheel when you drive. Add lower back (lumbar) support to your car seat, if needed.  Take short walks on even surfaces as soon as you are able. Try to increase the length of time you walk each day.  Do not sit, drive, or stand in one place for more than  30 minutes at a time. Sitting or standing for long periods of time can put stress on your back.  Do not drive or use heavy machinery while taking prescription pain medicine.  Use proper lifting techniques. When you bend and lift, use positions that put less stress on your back: ? Gaylord your knees. ? Keep the load close to your body. ? Avoid twisting.  Exercise regularly as told by your health care provider. Exercising helps your back heal faster and helps prevent back injuries by keeping muscles strong and flexible.  Work with a physical therapist to make a safe exercise program, as recommended by your health care provider. Do any exercises as told by your physical  therapist. Lifestyle  Maintain a healthy weight. Extra weight puts stress on your back and makes it difficult to have good posture.  Avoid activities or situations that make you feel anxious or stressed. Stress and anxiety increase muscle tension and can make back pain worse. Learn ways to manage anxiety and stress, such as through exercise. General instructions  Sleep on a firm mattress in a comfortable position. Try lying on your side with your knees slightly bent. If you lie on your back, put a pillow under your knees.  Follow your treatment plan as told by your health care provider. This may include: ? Cognitive or behavioral therapy. ? Acupuncture or massage therapy. ? Meditation or yoga. Contact a health care provider if:  You have pain that is not relieved with rest or medicine.  You have increasing pain going down into your legs or buttocks.  Your pain does not improve after 2 weeks.  You have pain at night.  You lose weight without trying.  You have a fever or chills. Get help right away if:  You develop new bowel or bladder control problems.  You have unusual weakness or numbness in your arms or legs.  You develop nausea or vomiting.  You develop abdominal pain.  You feel faint. Summary  Acute back pain is sudden and usually short-lived.  Use proper lifting techniques. When you bend and lift, use positions that put less stress on your back.  Take over-the-counter and prescription medicines and apply heat or ice as directed by your health care provider. This information is not intended to replace advice given to you by your health care provider. Make sure you discuss any questions you have with your health care provider. Document Revised: 04/24/2018 Document Reviewed: 08/17/2016 Elsevier Patient Education  2020 [employer].

## 2020-01-22 NOTE — Progress Notes (Unsigned)
Subjective:    Brandon Guerrero - 30 y.o. male MRN [id number]  Date of birth: April 02, 1990  HPI  Brandon Guerrero is to establish care. Patient has a PMH significant for seizure.   Current issues and/or concerns: 1. BACK PAIN: Reports back pain for 1 week. Says he attempted to make an appointment with Chiropractor since then but has been unable to do so because they did not accept his health insurance.   Location: lower middle back  Onset: sudden Description: 8-10/10 Radiation: legs  Symptoms Better with: nothing Trauma: denies Popping/clicking sounds with movement/bending: yes  Muscle spasms: yes Night pain: yes   ROS per HPI   Health Maintenance:  Health Maintenance Due  Topic Date Due  . Hepatitis C Screening  Never done   Past Medical History: Patient Active Problem List   Diagnosis Date Noted  . Seizure (HCC) 08/28/2014   Social History   reports that he has quit smoking. His smoking use included cigarettes. He quit after 12.00 years of use. He has never used smokeless tobacco. He reports current alcohol use. He reports current drug use. Drug: Marijuana.   Family History  family history is not on file.   Medications: reviewed and updated   Objective:   Physical Exam BP 114/78 (BP Location: Right Arm, Patient Position: Sitting, Cuff Size: Large)   Pulse 94   Temp (!) 97.2 F (36.2 C) (Temporal)   Resp 20   Ht 6\' 2"  (1.88 m)   Wt 260 lb 3.2 oz (118 kg)   SpO2 96%   BMI 33.41 kg/m  Physical Exam Constitutional:      Appearance: He is obese.  HENT:     Head: Normocephalic.  Eyes:     Extraocular Movements: Extraocular movements intact.     Pupils: Pupils are equal, round, and reactive to light.  Cardiovascular:     Rate and Rhythm: Normal rate and regular rhythm.     Pulses: Normal pulses.     Heart sounds: Normal heart sounds.  Pulmonary:     Effort: Pulmonary effort is normal.     Breath sounds: Normal breath sounds.  Musculoskeletal:        General:  Tenderness present.     Cervical back: Normal range of motion and neck supple.     Lumbar back: Tenderness present.  Neurological:     General: No focal deficit present.     Mental Status: He is alert and oriented to person, place, and time.  Psychiatric:        Mood and Affect: Mood normal.        Behavior: Behavior normal.      Assessment & Plan:  1. Encounter to establish care: - Patient presents today to establish care.  - Return in 4 to 6 weeks or sooner if needed for annual physical examination, labs, and health maintenance. Arrive fasting meaning having had no food and/or nothing to drink for at least 8 hours prior to appointment.  2. Acute midline low back pain with bilateral sciatica: - Naproxen as prescribed for back pain.  - Tizanidine as prescribed for muscle spasms of back. May cause drowsiness. Counseled patient to not consume if operating heavy machinery or driving. Counseled patient to not consume with alcohol. Patient verbalized understanding. - Per patient request referral to Chiropractic for further evaluation and management.  - Follow-up with primary provider as needed. - naproxen (NAPROSYN) 500 MG tablet; Take 1 tablet (500 mg total) by mouth 2 (two) times  daily with a meal.  Dispense: 30 tablet; Refill: 0 - tiZANidine (ZANAFLEX) 4 MG tablet; Take 1 tablet (4 mg total) by mouth every 12 (twelve) hours as needed for muscle spasms.  Dispense: 30 tablet; Refill: 0 - Ambulatory referral to Chiropractic  3. Influenza vaccine refused: - Declined flu vaccine during today's visit.  Ricky Stabs, NP 01/25/2020, 7:59 AM Primary Care at [facility]

## 2020-01-25 MED ORDER — TIZANIDINE HCL 4 MG PO TABS: 4.0000 mg | ORAL_TABLET | Freq: Two times a day (BID) | ORAL | 0 refills | Status: AC | PRN

## 2020-01-25 MED ORDER — NAPROXEN 500 MG PO TABS: 500.0000 mg | ORAL_TABLET | Freq: Two times a day (BID) | ORAL | 0 refills | Status: AC

## 2020-01-29 ENCOUNTER — Telehealth: Payer: Self-pay | Admitting: Family

## 2020-01-29 ENCOUNTER — Other Ambulatory Visit: Payer: Self-pay | Admitting: Neurology

## 2020-01-29 ENCOUNTER — Other Ambulatory Visit: Payer: Self-pay

## 2020-01-29 ENCOUNTER — Other Ambulatory Visit: Payer: Medicare Other

## 2020-01-29 DIAGNOSIS — Z20822 Contact with and (suspected) exposure to covid-19: Secondary | ICD-10-CM

## 2020-01-29 NOTE — Telephone Encounter (Signed)
1) Medication(s) Requested (by name): Depakote 500 MG    2) Pharmacy of Choice:  [facility] [phone] [place], [place] - [address]  [address], [place] [address]  Phone:  [phone] Fax:  [phone]  DEA #:  [id number]  3) Special Requests: Patient said that the refill exp and the pharmacy is requesting new rx Thanks    Approved medications will be sent to the pharmacy, we will reach out if there is an issue.  Requests made after 3pm may not be addressed until the following business day!  If a patient is unsure of the name of the medication(s) please note and ask patient to call back when they are able to provide all info, do not send to responsible party until all information is available!

## 2020-01-29 NOTE — Telephone Encounter (Signed)
Pls see refill request and send if appropriate

## 2020-01-29 NOTE — Telephone Encounter (Signed)
Needs appt for further refills, pls let me know when appt is scheduled and we can send refills until then, thanks

## 2020-01-29 NOTE — Telephone Encounter (Signed)
Pt is sch for f/u 10/05/20.

## 2020-01-31 LAB — SARS-COV-2, NAA 2 DAY TAT

## 2020-01-31 LAB — NOVEL CORONAVIRUS, NAA: SARS-CoV-2, NAA: DETECTED — AB

## 2020-01-31 NOTE — Telephone Encounter (Signed)
Medication refilled on 01/29/2020 by Dr. Karel Jarvis.

## 2020-09-09 ENCOUNTER — Telehealth: Payer: Self-pay | Admitting: Neurology

## 2020-09-09 MED ORDER — DIVALPROEX SODIUM 500 MG PO DR TAB: DELAYED_RELEASE_TABLET | ORAL | 1 refills | Status: DC

## 2020-09-09 NOTE — Telephone Encounter (Signed)
Pls have him increase Depakote 500mg : Take 3 tablets every night. I sent in a new Rx.  As per  driving laws, no driving until 6 months seizure-free. F/u as scheduled next month. Thanks

## 2020-09-09 NOTE — Telephone Encounter (Signed)
Tried calling pt, No answer. Phone just rung. No VM set up.

## 2020-09-09 NOTE — Telephone Encounter (Signed)
Pt  called in to let aquino know he had a seizure two weeks ago. He would like to discuss it with them. [phone]

## 2020-09-09 NOTE — Telephone Encounter (Signed)
Pt c/o: seizure had one on the 5th and the 12th  Missed medications?  No. Sleep deprived?  No. Alcohol intake?  No. Back to their usual baseline self?  Yes.  .  Any increase in stress? NO  Current medications prescribed by Dr. Karel Jarvis: Divalproex 500mg  TAKE 2 TABLETS BY MOUTH EVERY NIGHT  Came out the bathroom got dizzy and had seizure,

## 2020-09-24 ENCOUNTER — Other Ambulatory Visit: Payer: Self-pay | Admitting: Neurology

## 2020-10-05 ENCOUNTER — Encounter: Payer: Self-pay | Admitting: Neurology

## 2020-10-05 ENCOUNTER — Other Ambulatory Visit: Payer: Self-pay

## 2020-10-05 ENCOUNTER — Ambulatory Visit (INDEPENDENT_AMBULATORY_CARE_PROVIDER_SITE_OTHER): Payer: Medicare Other | Admitting: Neurology

## 2020-10-05 VITALS — BP 115/78 | HR 80 | Resp 18 | Ht 74.0 in | Wt 261.0 lb

## 2020-10-05 DIAGNOSIS — G40309 Generalized idiopathic epilepsy and epileptic syndromes, not intractable, without status epilepticus: Secondary | ICD-10-CM | POA: Diagnosis not present

## 2020-10-05 DIAGNOSIS — M25511 Pain in right shoulder: Secondary | ICD-10-CM

## 2020-10-05 DIAGNOSIS — M25512 Pain in left shoulder: Secondary | ICD-10-CM | POA: Diagnosis not present

## 2020-10-05 MED ORDER — DIVALPROEX SODIUM 500 MG PO DR TAB: DELAYED_RELEASE_TABLET | ORAL | 11 refills | Status: DC

## 2020-10-05 MED ORDER — LAMOTRIGINE 25 MG PO TABS: ORAL_TABLET | ORAL | 11 refills | Status: DC

## 2020-10-05 NOTE — Patient Instructions (Signed)
Start Lamotrigine 25mg : take 1 tablet daily  2. Continue Depakote 500mg : Take 2 tablets every night  3. Schedule 1-hour EEG  4. Schedule shoulder xrays  5. As per [employer] driving laws, no driving until 6 months seizure-free  6. Follow-up in 6 months, call for any changes   Seizure Precautions: 1. If medication has been prescribed for you to prevent seizures, take it exactly as directed.  Do not stop taking the medicine without talking to your doctor first, even if you have not had a seizure in a long time.   2. Avoid activities in which a seizure would cause danger to yourself or to others.  Don't operate dangerous machinery, swim alone, or climb in high or dangerous places, such as on ladders, roofs, or girders.  Do not drive unless your doctor says you may.  3. If you have any warning that you may have a seizure, lay down in a safe place where you can't hurt yourself.    4.  No driving for 6 months from last seizure, as per [facility].   Please refer to the following link on the [employer]ile[employer] of [employer]'s website for more information: http://www.epilepsyfoundation.org/answerplace/Social/driving/drivingu.cfm   5.  Maintain good sleep hygiene. Avoid alcohol.  6.  Contact your doctor if you have any problems that may be related to the medicine you are taking.  7.  Call 911 and bring the patient back to the ED if:        A.  The seizure lasts longer than 5 minutes.       B.  The patient doesn't awaken shortly after the seizure  C.  The patient has new problems such as difficulty seeing, speaking or moving  D.  The patient was injured during the seizure  E.  The patient has a temperature over 102 F (39C)  F.  The patient vomited and now is having trouble breathing

## 2020-10-05 NOTE — Progress Notes (Signed)
NEUROLOGY FOLLOW UP OFFICE NOTE  Brandon Guerrero [id number] [birth year]  HISTORY OF PRESENT ILLNESS: I had the pleasure of seeing Brandon Guerrero in follow-up in the neurology clinic on 10/05/2020.  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 He was lost to follow-up and contacted our office that he had 2 seizures in August, on 8/5 and 8/12. Prior to these, his last seizure was in 2019. He denies missing medication, he continues on Depakote DR 500mg  2 tabs qhs (1000mg  qhs). He has a 73-month old baby, he still gets similar amount of sleep but wakes up twice at night for the baby. The seizures occurred in the afternoon, he stood up and had a funny feeling, he tried to walk around to shake it then woke on the ground with EMS around him. His girlfriend said he was making a strange noise. He bit his tongue and had urinary incontinence both times. Since then, his shoulders L>R have been painful, he cannot lift his left arm about shoulder level. He denies any staring/unresponsive episodes, gaps in time, olfactory/gustatory hallucinations. He has occasional body jerks. He has headaches around once a week, no associated nausea/vomiting. He is occasionally light sensitive. He does not take any prn pain medication, he makes sure he stays hydrated and eats healthy. He has noticed he is a little more grouchy than usual. He does not think he is stressed. He is not working currently.   History on Initial Assessment 01/02/2019: This is a pleasant 30 year old right-handed man presenting to establish care for epilepsy. Seizures started at age 89 or 69, he recalls the first seizure occurred on Christmas eve, no prior warning symptoms, he was witnessed to fall with possible convulsive activity. He had seen neurologist Dr. 12 and had tests which "could not determine what was causing the seizures." He was started on Levetiracetam which caused him to sleep more and eat less. He  asked to switch to a different medication and states he has been on Depakote since then. He was initially having more seizures on the Levetiracetam, but feels that the Depakote has slowed them down. He thinks his last seizure was a year ago. He now gets a warning prior to his seizures, his vision gets kind of black, his mouth gets very dry, he gets sweaty and feels dizzy like he cannot stand straight. He has violent thoughts of doing violent things. He sees his hand shaking a little bit. He would also feel this way if he misses medication. Seizure triggers include missing medication, sleep deprivation, and alcohol. He has stopped drinking alcohol. He has had nocturnal convulsions. Around 2-3 years ago, he fell out of bed and woke up on the floor from a seizure, injuring his right knee. No focal weakness after. He lives with his niece who has mentioned occasional staring spells, he notices that he also stares off into space for 2-3 minutes. He has occasional myoclonic jerks where he would drop things. He denies any olfactory/gustatory hallucinations, focal numbness/tingling/weakness. He takes the Depakote 500mg  2 tabs every morning because he would tend to forget the afternoon dose. He still feels drowsy and sluggish on the Depakote, with some nausea. He has had more headaches recently, with a tight pulling sensation in the frontal region lasting until he takes Tylenol. No associated vomiting. He would be sensitive to lights. No diplopia, dysarthria/dysphagia, neck pain, bowel/bladder dysfunction. He has a little back pain. He works as a 08[birth year]10-1971.  Epilepsy Risk Factors:  He had a normal birth and early development.  There is no history of febrile convulsions, CNS infections such as meningitis/encephalitis, significant traumatic brain injury, neurosurgical procedures, or family history of seizures.  Prior AEDs: Levetiracetam Laboratory Data:  Lab Results  Component Value Date        VALPROATE 97 03/12/2018     PAST MEDICAL HISTORY: Past Medical History:  Diagnosis Date   Epilepsy (HCC)    Seizures (HCC)     MEDICATIONS: Current Outpatient Medications on File Prior to Visit  Medication Sig Dispense Refill   divalproex (DEPAKOTE) 500 MG DR tablet TAKE 2 TABLETS BY MOUTH EVERY NIGHT 90 tablet 1   naproxen (NAPROSYN) 500 MG tablet Take 1 tablet (500 mg total) by mouth 2 (two) times daily with a meal. 30 tablet 0   tiZANidine (ZANAFLEX) 4 MG tablet Take 1 tablet (4 mg total) by mouth every 12 (twelve) hours as needed for muscle spasms. 30 tablet 0   No current facility-administered medications on file prior to visit.    ALLERGIES: Allergies  Allergen Reactions   Bee Venom Swelling   Tomato Swelling    FAMILY HISTORY: Family History  Problem Relation Age of Onset   Diabetes Neg Hx    Heart disease Neg Hx    Cancer Neg Hx     SOCIAL HISTORY: Social History   Socioeconomic History   Marital status: Single    Spouse name: Not on file   Number of children: Not on file   Years of education: Not on file   Highest education level: Not on file  Occupational History   Not on file  Tobacco Use   Smoking status: Former    Years: 12.00    Types: Cigarettes   Smokeless tobacco: Never  Vaping Use   Vaping Use: Never used  Substance and Sexual Activity   Alcohol use: Yes    Comment: "Once every weeks"   Drug use: Yes    Types: Marijuana   Sexual activity: Not on file  Other Topics Concern   Not on file  Social History Narrative   Right handed      Highest level of edu- some college      One story home   Social Determinants of Health   Financial Resource Strain: Not on file  Food Insecurity: Not on file  Transportation Needs: Not on file  Physical Activity: Not on file  Stress: Not on file  Social Connections: Not on file  Intimate Partner Violence: Not on file     PHYSICAL EXAM: Vitals:   10/05/20 0842  BP: 115/78  Pulse: 80  Resp: 18  SpO2: 97%    General: No acute distress Head:  Normocephalic/atraumatic Skin/Extremities: No rash, no edema Neurological Exam: alert and awake. No aphasia or dysarthria. Fund of knowledge is appropriate.  Recent and remote memory are intact.  Attention and concentration are normal.   Cranial nerves: Pupils equal, round. Extraocular movements intact with no nystagmus. Visual fields full.  No facial asymmetry.  Motor: Bulk and tone normal, unable to lift left arm above shoulder due to pain, muscle strength 4/5 proximal UE due to pain, 5/5 distally, 5/5 both LE. Reflexes +1 throughout. Finger to nose testing intact.  Gait narrow-based and steady, able to tandem walk adequately.  Romberg negative.   IMPRESSION: This is a pleasant 30 yo RH man with a history of seizures since age 34 suggestive of a generalized epilepsy. He had been seizure-free  for 3 years until he had 2 convulsions in August, most recently 08/28/20. This may have been triggered by interrupted sleep with their new baby. He is on Depakote 1000mg  qhs with daytime drowsiness. We discussed adding on Lamotrigine 25mg  daily, potentially reduce Depakote dose moving forward. Side effects on Lamotrigine, including syndrome were discussed. Proceed with 1-hour EEG as previously discussed. He has been unable to lift arms above shoulder level since the seizure, bilateral shoulder xrays will be ordered. We again discussed [place] driving laws to stop driving until 6 months seizure-free. Follow-up in 6 months, call for any changes.    Thank you for allowing me to participate in his care.  Please do not hesitate to call for any questions or concerns.   , M.D.

## 2020-10-08 ENCOUNTER — Ambulatory Visit (INDEPENDENT_AMBULATORY_CARE_PROVIDER_SITE_OTHER): Payer: Medicare Other | Admitting: Neurology

## 2020-10-08 ENCOUNTER — Other Ambulatory Visit: Payer: Self-pay

## 2020-10-08 DIAGNOSIS — M25512 Pain in left shoulder: Secondary | ICD-10-CM

## 2020-10-08 DIAGNOSIS — M25511 Pain in right shoulder: Secondary | ICD-10-CM

## 2020-10-08 DIAGNOSIS — G40309 Generalized idiopathic epilepsy and epileptic syndromes, not intractable, without status epilepticus: Secondary | ICD-10-CM | POA: Diagnosis not present

## 2020-10-08 NOTE — Procedures (Signed)
ELECTROENCEPHALOGRAM REPORT  Date of Study: 10/08/2020  Patient's Name: Brandon Guerrero MRN: [id number] Date of Birth: 1990/10/28  Referring Provider: Dr. Patrcia Dolly  Clinical History: This is a 30 year old man with recurrent seizures, seizure-free for 3 years until he had 2 convulsions in August 2022. EEG for classification.  Medications: Depakote Lamotrigine NAPROSYN 500 MG tablet ZANAFLEX 4 MG tablet  Technical Summary: A multichannel digital 1-hour EEG recording measured by the international 10-20 system with electrodes applied with paste and impedances below 5000 ohms performed in our laboratory with EKG monitoring in an awake and asleep patient.  Hyperventilation was not performed. Photic stimulation was performed.  The digital EEG was referentially recorded, reformatted, and digitally filtered in a variety of bipolar and referential montages for optimal display.    Description: The patient is awake and asleep during the recording.  During maximal wakefulness, there is a symmetric, medium voltage 9 Hz posterior dominant rhythm that attenuates with eye opening.  The record is symmetric.  During drowsiness and sleep, there is an increase in theta slowing of the background.  Vertex waves and symmetric sleep spindles were seen.  Photic stimulation did not elicit any abnormalities.  There were no epileptiform discharges or electrographic seizures seen.    EKG lead was unremarkable.  Impression: This 1-hour awake and asleep EEG is normal.    Clinical Correlation: A normal EEG does not exclude a clinical diagnosis of epilepsy.  If further clinical questions remain, prolonged EEG may be helpful.  Clinical correlation is advised.   Patrcia Dolly, M.D.

## 2020-10-09 ENCOUNTER — Ambulatory Visit
Admission: RE | Admit: 2020-10-09 | Discharge: 2020-10-09 | Disposition: A | Discharge status: Home / Self Care | Payer: Medicare Other | Source: Ambulatory Visit | Attending: Neurology | Admitting: Neurology

## 2020-10-09 ENCOUNTER — Telehealth: Payer: Self-pay

## 2020-10-09 DIAGNOSIS — M25511 Pain in right shoulder: Secondary | ICD-10-CM

## 2020-10-09 DIAGNOSIS — G40309 Generalized idiopathic epilepsy and epileptic syndromes, not intractable, without status epilepticus: Secondary | ICD-10-CM

## 2020-10-09 NOTE — Telephone Encounter (Signed)
-----   Message from Van Clines, MD sent at 10/09/2020 11:00 AM EDT ----- Pls let him know the EEG is normal, however it is not like a pregnancy test that is positive or negative, just a snapshot of his brain waves. Continue with Depakote and Lamictal as prescribed. Thanks

## 2020-10-09 NOTE — Telephone Encounter (Signed)
Pt called and informed EEG is normal, however it is not like a pregnancy test that is positive or negative, just a snapshot of his brain waves. Continue with Depakote and Lamictal as prescribed

## 2020-10-12 ENCOUNTER — Telehealth: Payer: Self-pay

## 2020-10-12 DIAGNOSIS — M25511 Pain in right shoulder: Secondary | ICD-10-CM

## 2020-10-12 NOTE — Telephone Encounter (Signed)
-----   Message from Van Clines, MD sent at 10/12/2020 12:35 PM EDT ----- Pls let him know the shoulder xrays did not show any fracture or dislocation. Does he want to see ortho for his bilateral shoulder pain and difficulty raising arm up? Thanks

## 2020-10-12 NOTE — Telephone Encounter (Signed)
Pt called and informed shoulder xrays did not show any fracture or dislocation. Does he want to see ortho for his bilateral shoulder pain and difficulty raising arm up? Pt would like to see ortho

## 2020-10-13 ENCOUNTER — Other Ambulatory Visit: Payer: Self-pay

## 2020-10-13 ENCOUNTER — Encounter: Payer: Self-pay | Admitting: Orthopaedic Surgery

## 2020-10-13 ENCOUNTER — Ambulatory Visit (INDEPENDENT_AMBULATORY_CARE_PROVIDER_SITE_OTHER): Payer: Medicare Other | Admitting: Orthopaedic Surgery

## 2020-10-13 DIAGNOSIS — M25511 Pain in right shoulder: Secondary | ICD-10-CM

## 2020-10-13 DIAGNOSIS — G8929 Other chronic pain: Secondary | ICD-10-CM | POA: Diagnosis not present

## 2020-10-13 DIAGNOSIS — M25512 Pain in left shoulder: Secondary | ICD-10-CM | POA: Diagnosis not present

## 2020-10-13 DIAGNOSIS — R569 Unspecified convulsions: Secondary | ICD-10-CM | POA: Diagnosis not present

## 2020-10-13 MED ORDER — DICLOFENAC SODIUM 75 MG PO TBEC: 75.0000 mg | DELAYED_RELEASE_TABLET | Freq: Two times a day (BID) | ORAL | 2 refills | Status: AC

## 2020-10-13 MED ORDER — PREDNISONE 10 MG (21) PO TBPK: ORAL_TABLET | ORAL | 0 refills | Status: DC

## 2020-10-13 NOTE — Progress Notes (Signed)
Office Visit Note   Patient: Brandon Guerrero           Date of Birth: 13-Apr-1990           MRN: [id number] Visit Date: 10/13/2020              Requested by: Van Clines, MD [address] [place],  [place] [address] PCP: Patient, No Pcp Per (Inactive)   Assessment & Plan: Visit Diagnoses:  1. Chronic pain of both shoulders   2. Seizure (HCC)     Plan: In regards to the right shoulder this is likely muscular soreness status post seizure.  The left shoulder I am concerned that he may have a structural problem such as a rotator cuff.  His x-rays were normal but certainly I am concerned that he may have subluxed or dislocated his shoulder and relocated after the seizure.  We will see him back after the MRI.  For now I have sent a prescription for prednisone Dosepak and diclofenac.  Follow-Up Instructions: Return for after MRI.   Orders:  Orders Placed This Encounter  Procedures   MR Shoulder Left w/o contrast   Meds ordered this encounter  Medications   predniSONE (STERAPRED UNI-PAK 21 TAB) 10 MG (21) TBPK tablet    Sig: Take as directed    Dispense:  21 tablet    Refill:  0   diclofenac (VOLTAREN) 75 MG EC tablet    Sig: Take 1 tablet (75 mg total) by mouth 2 (two) times daily.    Dispense:  30 tablet    Refill:  2      Procedures: No procedures performed   Clinical Data: No additional findings.   Subjective: Chief Complaint  Patient presents with   Left Shoulder - Pain   Right Shoulder - Pain    Brandon Guerrero is a 30 year old gentleman here for evaluation of bilateral shoulder pain left greater than right.  History of epilepsy last month he had a severe seizure.  He woke up with shoulder pain and soreness in the right shoulder.  He has tried taking Flexeril without any relief.  He is unable to hold a milk bottle to feed his baby.  He is also dropping things out of his hand due to pain in the shoulder.  Numbness and tingling.   Review of Systems   Constitutional: Negative.   All other systems reviewed and are negative.   Objective: Vital Signs: There were no vitals taken for this visit.  Physical Exam Vitals and nursing note reviewed.  Constitutional:      Appearance: He is well-developed.  HENT:     Head: Normocephalic and atraumatic.  Eyes:     Pupils: Pupils are equal, round, and reactive to light.  Pulmonary:     Effort: Pulmonary effort is normal.  Abdominal:     Palpations: Abdomen is soft.  Musculoskeletal:        General: Normal range of motion.     Cervical back: Neck supple.  Skin:    General: Skin is warm.  Neurological:     Mental Status: He is alert and oriented to person, place, and time.  Psychiatric:        Behavior: Behavior normal.        Thought Content: Thought content normal.        Judgment: Judgment normal.    Ortho Exam  Left shoulder examination shows very limited passive and active range of motion secondary to  pain and guarding.  Manual muscle testing is very limited secondary to pain.  I am not able to move his arm away from the side of his body greater than 45 degrees.  Right shoulder shows mild discomfort with passive and active range of motion.  No focal deficits to manual muscle testing.  Specialty Comments:  No specialty comments available.  Imaging: No results found.   PMFS History: Patient Active Problem List   Diagnosis Date Noted   Chronic pain of both shoulders 10/13/2020   Seizure (HCC) 08/28/2014   Past Medical History:  Diagnosis Date   Epilepsy (HCC)    Seizures (HCC)     Family History  Problem Relation Age of Onset   Diabetes Neg Hx    Heart disease Neg Hx    Cancer Neg Hx     Past Surgical History:  Procedure Laterality Date   DEBRIDEMENT MANDIBLE Bilateral 03/13/2016   Procedure: I&D Bilateral  Mandible Fractures;  Surgeon: Ocie Doyne, DDS;  Location: [facility] OR;  Service: Oral Surgery;  Laterality: Bilateral;   Social History   Occupational History    Not on file  Tobacco Use   Smoking status: Former    Years: 12.00    Types: Cigarettes   Smokeless tobacco: Never  Vaping Use   Vaping Use: Never used  Substance and Sexual Activity   Alcohol use: Yes    Comment: "Once every weeks"   Drug use: Yes    Types: Marijuana   Sexual activity: Not on file

## 2020-11-03 ENCOUNTER — Telehealth: Payer: Self-pay | Admitting: Orthopaedic Surgery

## 2020-11-03 NOTE — Telephone Encounter (Signed)
Called and left pt vm to set appt with Dr. Roda Shutters after 10/29 for MRI Review

## 2020-11-05 ENCOUNTER — Telehealth: Payer: Self-pay | Admitting: Orthopaedic Surgery

## 2020-11-05 NOTE — Telephone Encounter (Signed)
Called pt 3x and left vm for pt to call and set appt after 10/29 with Dr. Roda Shutters for MRI review

## 2020-11-14 ENCOUNTER — Ambulatory Visit
Admission: RE | Admit: 2020-11-14 | Discharge: 2020-11-14 | Disposition: A | Discharge status: Home / Self Care | Payer: Medicare Other | Source: Ambulatory Visit | Attending: Orthopaedic Surgery | Admitting: Orthopaedic Surgery

## 2020-11-14 ENCOUNTER — Other Ambulatory Visit: Payer: Self-pay

## 2020-11-14 DIAGNOSIS — G8929 Other chronic pain: Secondary | ICD-10-CM

## 2020-11-26 ENCOUNTER — Encounter: Payer: Self-pay | Admitting: Neurology

## 2021-02-17 DIAGNOSIS — R000 Tachycardia, unspecified: Secondary | ICD-10-CM | POA: Diagnosis not present

## 2021-02-17 DIAGNOSIS — R569 Unspecified convulsions: Secondary | ICD-10-CM | POA: Diagnosis not present

## 2021-04-16 ENCOUNTER — Encounter: Payer: Self-pay | Admitting: Neurology

## 2021-04-16 ENCOUNTER — Ambulatory Visit (INDEPENDENT_AMBULATORY_CARE_PROVIDER_SITE_OTHER): Payer: Medicare Other | Admitting: Neurology

## 2021-04-16 VITALS — BP 131/81 | HR 82 | Ht 74.0 in | Wt 263.2 lb

## 2021-04-16 DIAGNOSIS — G40309 Generalized idiopathic epilepsy and epileptic syndromes, not intractable, without status epilepticus: Secondary | ICD-10-CM | POA: Diagnosis not present

## 2021-04-16 MED ORDER — LAMOTRIGINE 25 MG PO TABS: ORAL_TABLET | ORAL | 11 refills | Status: DC

## 2021-04-16 MED ORDER — DIVALPROEX SODIUM 500 MG PO DR TAB: DELAYED_RELEASE_TABLET | ORAL | 11 refills | Status: DC

## 2021-04-16 NOTE — Progress Notes (Signed)
? ?NEUROLOGY FOLLOW UP OFFICE NOTE ? ?Brandon Guerrero ?[id number] ?February 06, 1990 ? ?HISTORY OF PRESENT ILLNESS: ?I had the pleasure of seeing Brandon Guerrero in follow-up in the neurology clinic on 04/16/2021.  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  He is accompanied by his fiancee Brandon Guerrero who helps supplement the history today.  Records and images were personally reviewed where available.  His 1-hour EEG in 09/2020 was normal. On his last visit, he reported 2 seizures in August 2022, as well as daytime drowsiness on Depakote 1000mg  qhs, we added on low dose Lamotrigine 25mg  daily with plans to potentially reduce Depakote in the future. He was reporting shoulder pain after the seizures and was kindly seen by Ortho, he was given a prednisone course with plans for shoulder MRI but was lost to follow-up. They report he has had 2 more GTCs since last visit, he had one in January, then most recently 2 weeks ago where he knocked off everything on the dresser and threw up, when he opened the door, his mother-in-law found him confused with tongue bite. He denies any side effects on medications. He gets plenty of sleep. They deny any staring/unresponsive episodes,olfactory/gustatory hallucinations, focal numbness/tingling/weakness, myoclonic jerks. No headaches, vision changes. He gets dizzy when standing. Brandon Guerrero reports he does not eat well, something not eating until dinner time.  ? ? ?History on Initial Assessment 01/02/2019: This is a pleasant 31 year old right-handed man presenting to establish care for epilepsy. Seizures started at age 31 or 31, he recalls the first seizure occurred on Christmas eve, no prior warning symptoms, he was witnessed to fall with possible convulsive activity. He had seen neurologist Dr. Leta Baptist and had tests which "could not determine what was causing the seizures." He was started on Levetiracetam which caused him to sleep more and eat less. He asked to  switch to a different medication and states he has been on Depakote since then. He was initially having more seizures on the Levetiracetam, but feels that the Depakote has slowed them down. He thinks his last seizure was a year ago. He now gets a warning prior to his seizures, his vision gets kind of black, his mouth gets very dry, he gets sweaty and feels dizzy like he cannot stand straight. He has violent thoughts of doing violent things. He sees his hand shaking a little bit. He would also feel this way if he misses medication. Seizure triggers include missing medication, sleep deprivation, and alcohol. He has stopped drinking alcohol. He has had nocturnal convulsions. Around 2-3 years ago, he fell out of bed and woke up on the floor from a seizure, injuring his right knee. No focal weakness after. He lives with his niece who has mentioned occasional staring spells, he notices that he also stares off into space for 2-3 minutes. He has occasional myoclonic jerks where he would drop things. He denies any olfactory/gustatory hallucinations, focal numbness/tingling/weakness. He takes the Depakote 500mg  2 tabs every morning because he would tend to forget the afternoon dose. He still feels drowsy and sluggish on the Depakote, with some nausea. He has had more headaches recently, with a tight pulling sensation in the frontal region lasting until he takes Tylenol. No associated vomiting. He would be sensitive to lights. No diplopia, dysarthria/dysphagia, neck pain, bowel/bladder dysfunction. He has a little back pain. He works as a Biomedical scientist. ? ?Epilepsy Risk Factors:  He had a normal birth and early development.  There is  no history of febrile convulsions, CNS infections such as meningitis/encephalitis, significant traumatic brain injury, neurosurgical procedures, or family history of seizures. ? ?Prior AEDs: Levetiracetam ?Laboratory Data:  ?Lab Results  ?Component Value Date  ?     ? VALPROATE 97 03/12/2018  ? ? ?PAST  MEDICAL HISTORY: ?Past Medical History:  ?Diagnosis Date  ? Epilepsy ([place])   ? Seizures ([place])   ? ? ?MEDICATIONS: ?Current Outpatient Medications on File Prior to Visit  ?Medication Sig Dispense Refill  ? diclofenac (VOLTAREN) 75 MG EC tablet Take 1 tablet (75 mg total) by mouth 2 (two) times daily. 30 tablet 2  ? divalproex (DEPAKOTE) 500 MG DR tablet TAKE 2 TABLETS BY MOUTH EVERY NIGHT 60 tablet 11  ? lamoTRIgine (LAMICTAL) 25 MG tablet Take 1 tablet daily 30 tablet 11  ? naproxen (NAPROSYN) 500 MG tablet Take 1 tablet (500 mg total) by mouth 2 (two) times daily with a meal. 30 tablet 0  ? predniSONE (STERAPRED UNI-PAK 21 TAB) 10 MG (21) TBPK tablet Take as directed 21 tablet 0  ? tiZANidine (ZANAFLEX) 4 MG tablet Take 1 tablet (4 mg total) by mouth every 12 (twelve) hours as needed for muscle spasms. 30 tablet 0  ? ?No current facility-administered medications on file prior to visit.  ? ? ?ALLERGIES: ?Allergies  ?Allergen Reactions  ? Bee Venom Swelling  ? Tomato Swelling  ? ? ?FAMILY HISTORY: ?Family History  ?Problem Relation Age of Onset  ? Diabetes Neg Hx   ? Heart disease Neg Hx   ? Cancer Neg Hx   ? ? ?SOCIAL HISTORY: ?Social History  ? ?Socioeconomic History  ? Marital status: Single  ?  Spouse name: Not on file  ? Number of children: Not on file  ? Years of education: Not on file  ? Highest education level: Not on file  ?Occupational History  ? Not on file  ?Tobacco Use  ? Smoking status: Former  ?  Years: 12.00  ?  Types: Cigarettes  ? Smokeless tobacco: Never  ?Vaping Use  ? Vaping Use: Never used  ?Substance and Sexual Activity  ? Alcohol use: Yes  ?  Comment: "Once every weeks"  ? Drug use: Yes  ?  Types: Marijuana  ? Sexual activity: Not on file  ?Other Topics Concern  ? Not on file  ?Social History Narrative  ? Right handed  ?   ? Highest level of edu- some college  ?   ? One story home  ? ?Social Determinants of Health  ? ?Financial Resource Strain: Not on file  ?Food Insecurity: Not on file   ?Transportation Needs: Not on file  ?Physical Activity: Not on file  ?Stress: Not on file  ?Social Connections: Not on file  ?Intimate Partner Violence: Not on file  ? ? ? ?PHYSICAL EXAM: ?Vitals:  ? 04/16/21 1135  ?BP: 131/81  ?Pulse: 82  ?SpO2: 97%  ? ?General: No acute distress ?Head:  Normocephalic/atraumatic ?Skin/Extremities: No rash, no edema ?Neurological Exam: alert and awake. No aphasia or dysarthria. Fund of knowledge is appropriate.  Attention and concentration are normal.   Cranial nerves: Pupils equal, round. Extraocular movements intact with no nystagmus. Visual fields full.  No facial asymmetry.  Motor: Bulk and tone normal, muscle strength 5/5 throughout with no pronator drift.   Finger to nose testing intact.  Gait narrow-based and steady, able to tandem walk adequately.  Romberg negative. ? ? ?IMPRESSION: ?This is a pleasant 31 yo RH man with a history  of seizures since age 33 suggestive of a generalized epilepsy. EEG normal. Longest seizure-free interval of 3 years. They report 2 GTCs in the past 6 months, most recently 2 weeks ago. Increase Lamotrigine to 25mg  BID x 2 weeks, then to 50mg  BID. Continue Depakote 1000mg  qhs for now. He continues to report bilateral shoulder pain and was advised to contact Dr. Erlinda Hong for follow-up. He is aware of [place] driving laws to stop driving until 6 months seizure-free. Follow-up in 6 months, call for any changes ? ? ?Thank you for allowing me to participate in his care.  Please do not hesitate to call for any questions or concerns. ? ? ? ?Ellouise Newer, M.D. ? ? ? ? ?

## 2021-04-16 NOTE — Patient Instructions (Addendum)
Good to see you. ? ?Increase Lamotrigine 25mg : take 1 tablet twice a day for 2 weeks, then increase to 2 tablets twice a day ? ?2. Continue Depakote 500mg : take 2 tablets every night ? ?3. Continue keeping a calendar of the seizures ? ?4. Please contact Dr. office to schedule follow-up, call [phone] ? ?5. Follow-up in 6 months, call for any changes ? ? ?Seizure Precautions: ?1. If medication has been prescribed for you to prevent seizures, take it exactly as directed.  Do not stop taking the medicine without talking to your doctor first, even if you have not had a seizure in a long time.  ? ?2. Avoid activities in which a seizure would cause danger to yourself or to others.  Don't operate dangerous machinery, swim alone, or climb in high or dangerous places, such as on ladders, roofs, or girders.  Do not drive unless your doctor says you may. ? ?3. If you have any warning that you may have a seizure, lay down in a safe place where you can't hurt yourself.   ? ?4.  No driving for 6 months from last seizure, as per [facility].   Please refer to the following link on the [employer]ile[employer] of [employer]'s website for more information: http://www.epilepsyfoundation.org/answerplace/Social/driving/drivingu.cfm  ? ?5.  Maintain good sleep hygiene. Avoid alcohol. ? ?6.  Contact your doctor if you have any problems that may be related to the medicine you are taking. ? ?7.  Call 911 and bring the patient back to the ED if: ?      ? A.  The seizure lasts longer than 5 minutes.      ? B.  The patient doesn't awaken shortly after the seizure ? C.  The patient has new problems such as difficulty seeing, speaking or moving ? D.  The patient was injured during the seizure ? E.  The patient has a temperature over 102 F (39C) ? F.  The patient vomited and now is having trouble breathing ?      ? ?

## 2021-04-21 ENCOUNTER — Ambulatory Visit: Payer: Medicare Other | Admitting: Neurology

## 2021-07-02 ENCOUNTER — Encounter (HOSPITAL_COMMUNITY): Payer: Self-pay

## 2021-07-02 ENCOUNTER — Emergency Department (HOSPITAL_COMMUNITY): Payer: Medicare Other

## 2021-07-02 ENCOUNTER — Emergency Department (HOSPITAL_COMMUNITY)
Admission: EM | Admit: 2021-07-02 | Discharge: 2021-07-02 | Disposition: A | Discharge status: Home / Self Care | Payer: Medicare Other | Attending: Emergency Medicine | Admitting: Emergency Medicine

## 2021-07-02 DIAGNOSIS — W19XXXA Unspecified fall, initial encounter: Secondary | ICD-10-CM | POA: Diagnosis not present

## 2021-07-02 DIAGNOSIS — S0990XA Unspecified injury of head, initial encounter: Secondary | ICD-10-CM | POA: Diagnosis not present

## 2021-07-02 DIAGNOSIS — R569 Unspecified convulsions: Secondary | ICD-10-CM

## 2021-07-02 DIAGNOSIS — R0902 Hypoxemia: Secondary | ICD-10-CM | POA: Diagnosis not present

## 2021-07-02 DIAGNOSIS — I499 Cardiac arrhythmia, unspecified: Secondary | ICD-10-CM | POA: Diagnosis not present

## 2021-07-02 DIAGNOSIS — N179 Acute kidney failure, unspecified: Secondary | ICD-10-CM | POA: Diagnosis not present

## 2021-07-02 DIAGNOSIS — S0031XA Abrasion of nose, initial encounter: Secondary | ICD-10-CM | POA: Diagnosis not present

## 2021-07-02 DIAGNOSIS — S199XXA Unspecified injury of neck, initial encounter: Secondary | ICD-10-CM | POA: Diagnosis not present

## 2021-07-02 DIAGNOSIS — Z743 Need for continuous supervision: Secondary | ICD-10-CM | POA: Diagnosis not present

## 2021-07-02 LAB — BASIC METABOLIC PANEL
Anion gap: 16 — ABNORMAL HIGH (ref 5–15)
BUN: 14 mg/dL (ref 6–20)
CO2: 15 mmol/L — ABNORMAL LOW (ref 22–32)
Calcium: 9.5 mg/dL (ref 8.9–10.3)
Chloride: 109 mmol/L (ref 98–111)
Creatinine, Ser: 1.46 mg/dL — ABNORMAL HIGH (ref 0.61–1.24)
GFR, Estimated: >60 mL/min (ref >60)
Glucose, Bld: 130 mg/dL — ABNORMAL HIGH (ref 70–99)
Potassium: 4.8 mmol/L (ref 3.5–5.1)
Sodium: 140 mmol/L (ref 135–145)

## 2021-07-02 LAB — CBC WITH DIFFERENTIAL/PLATELET
Abs Immature Granulocytes: 0.06 10*3/uL (ref 0.00–0.07)
Basophils Absolute: 0 10*3/uL (ref 0.0–0.1)
Basophils Relative: 1 %
Eosinophils Absolute: 0.2 10*3/uL (ref 0.0–0.5)
Eosinophils Relative: 3 %
HCT: 46.4 % (ref 39.0–52.0)
Hemoglobin: 15.3 g/dL (ref 13.0–17.0)
Immature Granulocytes: 1 %
Lymphocytes Relative: 44 %
Lymphs Abs: 3 10*3/uL (ref 0.7–4.0)
MCH: 29.7 pg (ref 26.0–34.0)
MCHC: 33 g/dL (ref 30.0–36.0)
MCV: 89.9 fL (ref 80.0–100.0)
Monocytes Absolute: 0.3 10*3/uL (ref 0.1–1.0)
Monocytes Relative: 5 %
Neutro Abs: 3.1 10*3/uL (ref 1.7–7.7)
Neutrophils Relative %: 46 %
Platelets: 272 10*3/uL (ref 150–400)
RBC: 5.16 MIL/uL (ref 4.22–5.81)
RDW: 12.4 % (ref 11.5–15.5)
WBC: 6.7 10*3/uL (ref 4.0–10.5)
nRBC: 0 % (ref 0.0–0.2)

## 2021-07-02 LAB — VALPROIC ACID LEVEL: Valproic Acid Lvl: <10 ug/mL — ABNORMAL LOW (ref 50.0–100.0)

## 2021-07-02 NOTE — Discharge Instructions (Addendum)
You came to the emergency department today to be evaluated after experiencing seizures.  You returned to your baseline mental status.  The CT scans of your head, neck, and face did not show any broken bones or dislocations.  Your lab work showed that you have a acute kidney injury.  Please drink plenty of fluids and follow-up with your primary care doctor to have your kidney function rechecked.  Please follow-up closely with your neurologist for further evaluation of your seizures.  Please do not drive or operate any heavy machinery until cleared by your neurologist.  Get help right away if: You have: A seizure that does not stop after 5 minutes. Several seizures in a row without a complete recovery between seizures. A seizure that makes it harder to breathe. A seizure that leaves you unable to speak or use a part of your body. You do not wake up right away after a seizure. You injure yourself during a seizure. You have confusion or pain right after a seizure.

## 2021-07-02 NOTE — ED Provider Notes (Signed)
[facility] EMERGENCY DEPARTMENT Provider Note   CSN: [id number] Arrival date & time: 07/02/21  1141     History  Chief Complaint  Patient presents with   Seizures    Brandon Guerrero is a 31 y.o. male with history of seizures (followed by [facility] neurology; on Lamictal and Depakote).  Brought to the emergency department by EMS with reports of seizures.  EMS reports that patient had 3 episodes of seizures while in the bathroom.  Patient was postictal with EMS.  On my assessment patient is able to answer some questions.  Reports having seizures this morning but does not have any further information regarding them.  Patient does endorse falling with complaints of pain to the left side of his face, tongue, neck, and a headache.  Patient endorses compliance with his Lamictal and Depakote.    Denies any illicit drug or alcohol use.  Denies any numbness, weakness, facial asymmetry, dysarthria, back pain, chest pain, shortness of breath.   Seizures      Home Medications Prior to Admission medications   Medication Sig Start Date End Date Taking? Authorizing Provider  diclofenac (VOLTAREN) 75 MG EC tablet Take 1 tablet (75 mg total) by mouth 2 (two) times daily. 10/13/20   Tarry Kos, MD  divalproex (DEPAKOTE) 500 MG DR tablet TAKE 2 TABLETS BY MOUTH EVERY NIGHT 04/16/21   Van Clines, MD  lamoTRIgine (LAMICTAL) 25 MG tablet Take 1 tablet twice a day for 2 weeks, then increase to 2 tablets twice a day and continue 04/16/21   Van Clines, MD  naproxen (NAPROSYN) 500 MG tablet Take 1 tablet (500 mg total) by mouth 2 (two) times daily with a meal. 01/25/20   Rema Fendt, NP  tiZANidine (ZANAFLEX) 4 MG tablet Take 1 tablet (4 mg total) by mouth every 12 (twelve) hours as needed for muscle spasms. 01/25/20   Rema Fendt, NP      Allergies    Bee venom and Tomato    Review of Systems   Review of Systems  Constitutional:  Negative for chills and fever.  Eyes:   Negative for visual disturbance.  Respiratory:  Negative for shortness of breath.   Cardiovascular:  Negative for chest pain.  Gastrointestinal:  Negative for abdominal pain, nausea and vomiting.  Musculoskeletal:  Positive for neck pain. Negative for back pain.  Skin:  Negative for color change and rash.  Neurological:  Positive for seizures and headaches. Negative for dizziness, tremors, syncope, facial asymmetry, speech difficulty, weakness, light-headedness and numbness.  Psychiatric/Behavioral:  Negative for confusion.     Physical Exam Updated Vital Signs BP 111/67 (BP Location: Left Arm)   Pulse 95   Temp (!) 97.4 F (36.3 C) (Oral)   Resp 15   Ht 6\' 2"  (1.88 m)   Wt 119.4 kg   SpO2 94%   BMI 33.80 kg/m  Physical Exam Vitals and nursing note reviewed.  Constitutional:      General: He is not in acute distress.    Appearance: He is not ill-appearing, toxic-appearing or diaphoretic.     Interventions: Cervical collar in place.     Comments: Immobilized in a cervical collar  HENT:     Head: Normocephalic. Abrasion present. No raccoon eyes, Battle's sign, contusion or laceration.     Jaw: No trismus.     Comments: Swelling and superficial abrasion to left side of patient's nose.    Mouth/Throat:     Comments: Laceration  to right lateral aspect of patient's tongue, laceration is 2 to 3 mm in size and partial-thickness.  Handles oral secretions without difficulty. Eyes:     General: No scleral icterus.       Right eye: No discharge.        Left eye: No discharge.  Neck:     Comments: Diffuse tenderness to posterior neck through c-collar. Cardiovascular:     Rate and Rhythm: Normal rate.  Pulmonary:     Effort: Pulmonary effort is normal.  Musculoskeletal:     Cervical back: Tenderness and bony tenderness present.     Thoracic back: No swelling, edema, deformity, signs of trauma, lacerations, spasms, tenderness or bony tenderness.     Lumbar back: No swelling, edema,  deformity, signs of trauma, lacerations, spasms, tenderness or bony tenderness.     Comments: No midline tenderness or deformity to thoracic, or lumbar spine.  No tenderness, bony tenderness, or deformity to bilateral upper or lower extremities.  Patient has full active range of motion to bilateral upper and lower extremities without difficulty or complaints of pain.  Skin:    General: Skin is warm and dry.  Neurological:     General: No focal deficit present.     Mental Status: He is alert.     GCS: GCS eye subscore is 4. GCS verbal subscore is 5. GCS motor subscore is 6.     Comments: No facial asymmetry.  Patient moves all limbs equally without difficulty.  Psychiatric:        Behavior: Behavior is cooperative.     ED Results / Procedures / Treatments   Labs (all labs ordered are listed, but only abnormal results are displayed) Labs Reviewed  BASIC METABOLIC PANEL - Abnormal; Notable for the following components:      Result Value   CO2 15 (*)    Glucose, Bld 130 (*)    Creatinine, Ser 1.46 (*)    Anion gap 16 (*)    All other components within normal limits  CBC WITH DIFFERENTIAL/PLATELET  VALPROIC ACID LEVEL  LAMOTRIGINE LEVEL    EKG None  Radiology No results found.  Procedures Procedures    Medications Ordered in ED Medications - No data to display  ED Course/ Medical Decision Making/ A&P                           Medical Decision Making Amount and/or Complexity of Data Reviewed Labs: ordered. Radiology: ordered.   Alert 31 year old male alert, not oriented at this time.  In no acute distress.  Presents to the emergency department with a chief complaint of seizures.  Information is obtained from EMS and patient.  Past medical records were reviewed including previous provider notes from specialist, labs, and imaging.  Patient has medical history as outlined in HPI which complicates his care.  Patient had reported seizures at home.  Reports that he has  been compliant with his medication regiment.  Patient has swelling to his face and reported fall.  Will obtain noncontrast head, cervical spine, and maxillofacial imaging to look for acute intracranial abnormality as well as acute osseous abnormalities.  Will check EKG and basic lab work.  I personally viewed and interpreted patient's CT imaging.  Agree with radiology interpretation of no acute intracranial abnormality, no acute osseous abnormality.  I personally viewed interpret patient's lab results.  Pertinent findings include: -Creatinine 1.46 -Depakote and Lamictal levels pending  Patient is back to  baseline mental status is found to be alert to person, place, and time.  Patient is able to stand and ambulate without difficulty.  No further episodes of seizures.  Patient is hemodynamically stable.  Will discharge patient at this time to follow-up closely with neurology for further management of his seizures.  Patient was vies to increase his fluid intake as well as follow-up closely with PCP for repeat check of kidney function.  Based on patient's chief complaint, I considered admission might be necessary, however after reassuring ED workup feel patient is reasonable for discharge.  Discussed results, findings, treatment and follow up. Patient advised of return precautions. Patient verbalized understanding and agreed with plan.  Portions of this note were generated with Scientist, clinical (histocompatibility and immunogenetics). Dictation errors may occur despite best attempts at proofreading.        Final Clinical Impression(s) / ED Diagnoses Final diagnoses:  Seizure (HCC)  AKI (acute kidney injury) [facility])    Rx / DC Orders ED Discharge Orders     None         Berneice Heinrich 07/02/21 1518    Derwood Kaplan, MD 07/03/21 1121

## 2021-07-02 NOTE — ED Triage Notes (Signed)
Hx of seizures. EMS reports 2-3 seizure episodes while in the bathroom. On Dilantin, Lamictal and Keppra. Pt reports last seizure episode was approx 4-6 weeks. Pt is able to answer some questions at this time. Complains of headache. C-collar in place by EMS.

## 2021-07-05 LAB — LAMOTRIGINE LEVEL: Lamotrigine Lvl: <1 ug/mL — ABNORMAL LOW (ref 2.0–20.0)

## 2021-07-07 ENCOUNTER — Other Ambulatory Visit: Payer: Self-pay

## 2021-07-07 ENCOUNTER — Telehealth: Payer: Self-pay

## 2021-07-07 DIAGNOSIS — G40309 Generalized idiopathic epilepsy and epileptic syndromes, not intractable, without status epilepticus: Secondary | ICD-10-CM

## 2021-07-07 NOTE — Telephone Encounter (Signed)
Called pt no answer and mail box is no set up

## 2021-07-07 NOTE — Telephone Encounter (Signed)
-----   Message from Van Clines, MD sent at 07/06/2021  3:40 PM EDT ----- Regarding: FW: Pt. ED visit Lamictal level Pls let him know I received the notes from the ER about his recent seizures. His levels were very low, has he missed any doses? Please confirm how he is taking his Depakote and Lamotrigine. Would like to recheck levels in a week. No driving until 6 months seizure-free. Thanks   ----- Message ----- From: Kathe Becton, RN Sent: 07/06/2021   3:21 PM EDT To: Van Clines, MD Subject: Pt. ED visit Lamictal level                    Hi Dr. Karel Jarvis, I wanted to send this lab for pt.s follow up visit on 11/12/2021.  Pt. Was seen in our ED on June 16th for a seizure.  I also informed the patient.    Thank you, Cher Nakai, RN Ed Clinical Care Coordinator [facility]

## 2021-07-07 NOTE — Telephone Encounter (Signed)
Patient called back

## 2021-07-07 NOTE — Telephone Encounter (Signed)
Pls remind him that the Lamictal 25mg  tablet is supposed to be taken 2 tablets in AM, 2 tablets in PM. The Depakote is correct, 2 tablets once a day.

## 2021-08-02 DIAGNOSIS — M19072 Primary osteoarthritis, left ankle and foot: Secondary | ICD-10-CM | POA: Diagnosis not present

## 2021-08-02 DIAGNOSIS — M79674 Pain in right toe(s): Secondary | ICD-10-CM | POA: Diagnosis not present

## 2021-11-12 ENCOUNTER — Ambulatory Visit (INDEPENDENT_AMBULATORY_CARE_PROVIDER_SITE_OTHER): Payer: Medicare Other | Admitting: Neurology

## 2021-11-12 ENCOUNTER — Encounter: Payer: Self-pay | Admitting: Neurology

## 2021-11-12 ENCOUNTER — Other Ambulatory Visit (INDEPENDENT_AMBULATORY_CARE_PROVIDER_SITE_OTHER): Payer: Medicare Other

## 2021-11-12 VITALS — BP 125/86 | HR 84 | Ht 74.0 in | Wt 262.8 lb

## 2021-11-12 DIAGNOSIS — G40309 Generalized idiopathic epilepsy and epileptic syndromes, not intractable, without status epilepticus: Secondary | ICD-10-CM

## 2021-11-12 MED ORDER — DIVALPROEX SODIUM 500 MG PO DR TAB: DELAYED_RELEASE_TABLET | ORAL | 3 refills | Status: DC

## 2021-11-12 MED ORDER — LAMOTRIGINE 25 MG PO TABS: ORAL_TABLET | ORAL | 3 refills | Status: DC

## 2021-11-12 NOTE — Progress Notes (Signed)
NEUROLOGY FOLLOW UP OFFICE NOTE  Brandon Guerrero [id number] Dec 02, 1990  HISTORY OF PRESENT ILLNESS: I had the pleasure of seeing Brandon Guerrero in follow-up in the neurology clinic on 11/12/2021.  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 He is alone in the office today.  Records and images were personally reviewed where available.  Since his last visit, he was in the ER after having 3 seizures on 07/02/21, hitting the left side of his face. He recalls feeling anxious with palpitations prior to the seizures. Head CT normal. Depakote level was <10, Lamotrigine level <1.0. Our office contacted him about this and he reported he was taking 2 Depakote tablets in the morning and 2 Lamotrigine tablets in PM. He was instructed to increase Lamotrigine to 2 tabs BID, continue Depakote 2 tablets daily, then recheck levels. He has not had levels checked yet, but reports that since taking medications as prescribed, he has been doing well with no seizures since 07/02/21. He has been eating well and taking better care of himself. He denies any staring/unresponsive episodes, gaps in time, olfactory/gustatory hallucinations, focal numbness/tingling/weakness, myoclonic jerks. No headaches, dizziness, vision changes, no falls. No side effects on medications except for drowsiness on Depakote which he takes at night and gets 8.5 hours of sleep. He denies any alcohol use. Mood is a little grumpy sometimes. He works in [employer]. He lives with his wife and 63 year old son.   History on Initial Assessment 01/02/2019: This is a pleasant 31 year old right-handed man presenting to establish care for epilepsy. Seizures started at age 31 or 55, he recalls the first seizure occurred on Christmas eve, no prior warning symptoms, he was witnessed to fall with possible convulsive activity. He had seen neurologist Dr. Marjory Lies and had tests which "could not determine what was causing the seizures." He  was started on Levetiracetam which caused him to sleep more and eat less. He asked to switch to a different medication and states he has been on Depakote since then. He was initially having more seizures on the Levetiracetam, but feels that the Depakote has slowed them down. He thinks his last seizure was a year ago. He now gets a warning prior to his seizures, his vision gets kind of black, his mouth gets very dry, he gets sweaty and feels dizzy like he cannot stand straight. He has violent thoughts of doing violent things. He sees his hand shaking a little bit. He would also feel this way if he misses medication. Seizure triggers include missing medication, sleep deprivation, and alcohol. He has stopped drinking alcohol. He has had nocturnal convulsions. Around 2-3 years ago, he fell out of bed and woke up on the floor from a seizure, injuring his right knee. No focal weakness after. He lives with his niece who has mentioned occasional staring spells, he notices that he also stares off into space for 2-3 minutes. He has occasional myoclonic jerks where he would drop things. He denies any olfactory/gustatory hallucinations, focal numbness/tingling/weakness. He takes the Depakote 500mg  2 tabs every morning because he would tend to forget the afternoon dose. He still feels drowsy and sluggish on the Depakote, with some nausea. He has had more headaches recently, with a tight pulling sensation in the frontal region lasting until he takes Tylenol. No associated vomiting. He would be sensitive to lights. No diplopia, dysarthria/dysphagia, neck pain, bowel/bladder dysfunction. He has a little back pain. He works as a .  Epilepsy Risk Factors:  He had a normal birth and early development.  There is no history of febrile convulsions, CNS infections such as meningitis/encephalitis, significant traumatic brain injury, neurosurgical procedures, or family history of seizures.  Prior AEDs: Levetiracetam Laboratory  Data:  Lab Results  Component Value Date        VALPROATE 97 03/12/2018    PAST MEDICAL HISTORY: Past Medical History:  Diagnosis Date   Epilepsy (HCC)    Seizures (HCC)     MEDICATIONS: Current Outpatient Medications on File Prior to Visit  Medication Sig Dispense Refill   diclofenac (VOLTAREN) 75 MG EC tablet Take 1 tablet (75 mg total) by mouth 2 (two) times daily. 30 tablet 2   divalproex (DEPAKOTE) 500 MG DR tablet TAKE 2 TABLETS BY MOUTH EVERY NIGHT 60 tablet 11   lamoTRIgine (LAMICTAL) 25 MG tablet Take 1 tablet twice a day for 2 weeks, then increase to 2 tablets twice a day and continue 120 tablet 11   naproxen (NAPROSYN) 500 MG tablet Take 1 tablet (500 mg total) by mouth 2 (two) times daily with a meal. 30 tablet 0   tiZANidine (ZANAFLEX) 4 MG tablet Take 1 tablet (4 mg total) by mouth every 12 (twelve) hours as needed for muscle spasms. 30 tablet 0   No current facility-administered medications on file prior to visit.    ALLERGIES: Allergies  Allergen Reactions   Bee Venom Swelling   Tomato Swelling    FAMILY HISTORY: Family History  Problem Relation Age of Onset   Diabetes Neg Hx    Heart disease Neg Hx    Cancer Neg Hx     SOCIAL HISTORY: Social History   Socioeconomic History   Marital status: Single    Spouse name: Not on file   Number of children: Not on file   Years of education: Not on file   Highest education level: Not on file  Occupational History   Not on file  Tobacco Use   Smoking status: Former    Years: 12.00    Types: Cigarettes   Smokeless tobacco: Never  Vaping Use   Vaping Use: Some days  Substance and Sexual Activity   Alcohol use: Yes    Comment: "Once every weeks"   Drug use: Yes    Types: Marijuana   Sexual activity: Not on file  Other Topics Concern   Not on file  Social History Narrative   Right handed      Highest level of edu- some college      One story home   Social Determinants of Health   Financial  Resource Strain: Not on file  Food Insecurity: Not on file  Transportation Needs: Not on file  Physical Activity: Not on file  Stress: Not on file  Social Connections: Not on file  Intimate Partner Violence: Not on file     PHYSICAL EXAM: Vitals:   11/12/21 0945  BP: 125/86  Pulse: 84  SpO2: 98%   General: No acute distress Head:  Normocephalic/atraumatic, scar on forehead Skin/Extremities: No rash, no edema Neurological Exam: alert and awake. No aphasia or dysarthria. Fund of knowledge is appropriate.  Attention and concentration are normal.   Cranial nerves: Pupils equal, round. Extraocular movements intact with no nystagmus. Visual fields full.  No facial asymmetry.  Motor: Bulk and tone normal, muscle strength 5/5 throughout with no pronator drift.   Finger to nose testing intact.  Gait narrow-based and steady, able to tandem walk adequately.  Romberg  negative.   IMPRESSION: This is a pleasant 31 yo RH man with a history of seizures since age 66 suggestive of a generalized epilepsy. EEG normal. Longest seizure-free interval of 3 years. He had 3 seizures in one day on 07/02/21, none since taking medications as prescribed. Check Depakote and Lamotrigine levels. Continue Lamotrigine 25mg  2 tabs BID and Depakote 500mg  2 tabs qhs, we may adjust dose depending on levels, he reports compliance to medications. He is aware of [place] driving laws to stop driving until 6 months seizure-free. Follow-up in 6 months, call for any changes.    Thank you for allowing me to participate in his care.  Please do not hesitate to call for any questions or concerns.    Ellouise Newer, M.D.

## 2021-11-12 NOTE — Patient Instructions (Signed)
Good to see you doing well.  Have bloodwork done for Lamotrigine and Depakote levels  2. Continue Divalproex 500mg : take 2 tablets every night  3. Continue Lamotrigine 25mg : take 2 tablets in AM, 2 tablets in PM  4. Our office will call with bloodwork results and if we need to make any changes  5. Follow-up in 6 months, call for any changes   Seizure Precautions: 1. If medication has been prescribed for you to prevent seizures, take it exactly as directed.  Do not stop taking the medicine without talking to your doctor first, even if you have not had a seizure in a long time.   2. Avoid activities in which a seizure would cause danger to yourself or to others.  Don't operate dangerous machinery, swim alone, or climb in high or dangerous places, such as on ladders, roofs, or girders.  Do not drive unless your doctor says you may.  3. If you have any warning that you may have a seizure, lay down in a safe place where you can't hurt yourself.    4.  No driving for 6 months from last seizure, as per [facility].   Please refer to the following link on the [place] website for more information: http://www.epilepsyfoundation.org/answerplace/Social/driving/drivingu.cfm   5.  Maintain good sleep hygiene. Avoid alcohol.  6.  Contact your doctor if you have any problems that may be related to the medicine you are taking.  7.  Call 911 and bring the patient back to the ED if:        A.  The seizure lasts longer than 5 minutes.       B.  The patient doesn't awaken shortly after the seizure  C.  The patient has new problems such as difficulty seeing, speaking or moving  D.  The patient was injured during the seizure  E.  The patient has a temperature over 102 F (39C)  F.  The patient vomited and now is having trouble breathing

## 2021-11-14 LAB — LAMOTRIGINE LEVEL: Lamotrigine Lvl: <0.5 ug/mL — ABNORMAL LOW (ref 2.5–15.0)

## 2021-11-14 LAB — VALPROIC ACID LEVEL: Valproic Acid Lvl: 84 mg/L (ref 50.0–100.0)

## 2021-11-15 MED ORDER — LAMOTRIGINE 100 MG PO TABS: 100.0000 mg | ORAL_TABLET | Freq: Two times a day (BID) | ORAL | 3 refills | Status: DC

## 2021-11-18 ENCOUNTER — Telehealth: Payer: Self-pay | Admitting: *Deleted

## 2021-11-18 NOTE — Telephone Encounter (Signed)
Pt questioning where he can go for quick STD testing.  Denies any symptoms.  Advised [facility], offered number, states he knows where to go.

## 2021-11-19 ENCOUNTER — Ambulatory Visit
Admission: RE | Admit: 2021-11-19 | Discharge: 2021-11-19 | Disposition: A | Discharge status: Home / Self Care | Payer: Medicare Other | Source: Ambulatory Visit | Attending: Emergency Medicine | Admitting: Emergency Medicine

## 2021-11-19 ENCOUNTER — Telehealth: Payer: Self-pay

## 2021-11-19 VITALS — BP 112/76 | HR 82 | Temp 98.1°F | Resp 18

## 2021-11-19 DIAGNOSIS — R569 Unspecified convulsions: Secondary | ICD-10-CM

## 2021-11-19 DIAGNOSIS — Z113 Encounter for screening for infections with a predominantly sexual mode of transmission: Secondary | ICD-10-CM

## 2021-11-19 NOTE — Discharge Instructions (Signed)
The results of your STD testing today which screens for gonorrhea, chlamydia, and trichomonas will be made posted to your MyChart account once it is complete.  This typically takes 2 to 4 days.  Please abstain from sexual intercourse of any kind, vaginal, oral or anal, until you have received the results of your STD testing.     If any of your results are abnormal, you will receive a phone call regarding treatment.  Prescriptions, if any are needed, will be provided for you at your pharmacy.     Thank you for visiting urgent care today.  I appreciate the opportunity to participate in your care.  

## 2021-11-19 NOTE — ED Triage Notes (Signed)
Pt requesting to be tested for STDs and denies having symptoms.

## 2021-11-19 NOTE — Telephone Encounter (Signed)
Pt called requesting to be to be seen for STD testing  ** The message may be incomplete. It was sent as a result of a timeout. **   Pt already contacted by NT. This encounter was created in error - please disregard.

## 2021-11-19 NOTE — ED Provider Notes (Signed)
[facility]    CSN: [id number] Arrival date & time: 11/19/21  1551    HISTORY   Chief Complaint  Patient presents with   SEXUALLY TRANSMITTED DISEASE   HPI Brandon Guerrero is a pleasant, 31 y.o. male who presents to urgent care today. Patient is requesting screening for STDs, denies burning with urination, penile discharge, scrotal pain, testicular pain, scrotal swelling, testicular swelling, perineal pain, pain with defecation, abdominal pain, fever, aches, genital lesion, known STD exposure.  EMR reviewed, patient has a history of chlamydia and gonorrhea, last documented in 2020.  The history is provided by the patient.   Past Medical History:  Diagnosis Date   Epilepsy ([place])    Seizures [facility])    Patient Active Problem List   Diagnosis Date Noted   Chronic pain of both shoulders 10/13/2020   Seizure ([place]) 08/28/2014   Past Surgical History:  Procedure Laterality Date   DEBRIDEMENT MANDIBLE Bilateral 03/13/2016   Procedure: I&D Bilateral  Mandible Fractures;  Surgeon: Diona Browner, DDS;  Location: [place];  Service: Oral Surgery;  Laterality: Bilateral;    Home Medications    Prior to Admission medications   Medication Sig Start Date End Date Taking? Authorizing Provider  diclofenac (VOLTAREN) 75 MG EC tablet Take 1 tablet (75 mg total) by mouth 2 (two) times daily. 10/13/20   Leandrew Koyanagi, MD  divalproex (DEPAKOTE) 500 MG DR tablet TAKE 2 TABLETS BY MOUTH EVERY NIGHT 11/12/21   Cameron Sprang, MD  lamoTRIgine (LAMICTAL) 100 MG tablet Take 1 tablet (100 mg total) by mouth 2 (two) times daily. 11/15/21   Cameron Sprang, MD  naproxen (NAPROSYN) 500 MG tablet Take 1 tablet (500 mg total) by mouth 2 (two) times daily with a meal. 01/25/20   Camillia Herter, NP  tiZANidine (ZANAFLEX) 4 MG tablet Take 1 tablet (4 mg total) by mouth every 12 (twelve) hours as needed for muscle spasms. 01/25/20   Camillia Herter, NP    Family History Family History  Problem Relation Age  of Onset   Diabetes Neg Hx    Heart disease Neg Hx    Cancer Neg Hx    Social History Social History   Tobacco Use   Smoking status: Former    Years: 12.00    Types: Cigarettes   Smokeless tobacco: Never  Vaping Use   Vaping Use: Some days  Substance Use Topics   Alcohol use: Yes    Comment: "Once every weeks"   Drug use: Yes    Types: Marijuana   Allergies   Bee venom and Tomato  Review of Systems Review of Systems Pertinent findings revealed after performing a 14 point review of systems has been noted in the history of present illness.  Physical Exam Triage Vital Signs ED Triage Vitals  Enc Vitals Group     BP 11/13/20 0827 (!) 147/82     Pulse Rate 11/13/20 0827 72     Resp 11/13/20 0827 18     Temp 11/13/20 0827 98.3 F (36.8 C)     Temp Source 11/13/20 0827 Oral     SpO2 11/13/20 0827 98 %     Weight --      Height --      Head Circumference --      Peak Flow --      Pain Score 11/13/20 0826 5     Pain Loc --      Pain Edu? --  Excl. in GC? --   No data found.  Updated Vital Signs BP 112/76 (BP Location: Right Arm)   Pulse 82   Temp 98.1 F (36.7 C) (Oral)   Resp 18   SpO2 96%   Physical Exam Vitals and nursing note reviewed.  Constitutional:      General: He is not in acute distress.    Appearance: Normal appearance. He is not ill-appearing.  HENT:     Head: Normocephalic and atraumatic.  Eyes:     General: Lids are normal.        Right eye: No discharge.        Left eye: No discharge.     Extraocular Movements: Extraocular movements intact.     Conjunctiva/sclera: Conjunctivae normal.     Right eye: Right conjunctiva is not injected.     Left eye: Left conjunctiva is not injected.  Neck:     Trachea: Trachea and phonation normal.  Cardiovascular:     Rate and Rhythm: Normal rate and regular rhythm.     Pulses: Normal pulses.     Heart sounds: Normal heart sounds. No murmur heard.    No friction rub. No gallop.  Pulmonary:      Effort: Pulmonary effort is normal. No accessory muscle usage, prolonged expiration or respiratory distress.     Breath sounds: Normal breath sounds. No stridor, decreased air movement or transmitted upper airway sounds. No decreased breath sounds, wheezing, rhonchi or rales.  Chest:     Chest wall: No tenderness.  Genitourinary:    Comments: Pt politely declines GU exam, pt did provide a penile swab for testing.   Musculoskeletal:        General: Normal range of motion.     Cervical back: Normal range of motion and neck supple. Normal range of motion.  Lymphadenopathy:     Cervical: No cervical adenopathy.  Skin:    General: Skin is warm and dry.     Findings: No erythema or rash.  Neurological:     General: No focal deficit present.     Mental Status: He is alert and oriented to person, place, and time.  Psychiatric:        Mood and Affect: Mood normal.        Behavior: Behavior normal.     Visual Acuity Right Eye Distance:   Left Eye Distance:   Bilateral Distance:    Right Eye Near:   Left Eye Near:    Bilateral Near:     UC Couse / Diagnostics / Procedures:     Radiology No results found.  Procedures Procedures (including critical care time) EKG  Pending results:  Labs Reviewed  CYTOLOGY, (ORAL, ANAL, URETHRAL) ANCILLARY ONLY    Medications Ordered in UC: Medications - No data to display  UC Diagnoses / Final Clinical Impressions(s)   I have reviewed the triage vital signs and the nursing notes.  Pertinent labs & imaging results that were available during my care of the patient were reviewed by me and considered in my medical decision making (see chart for details).    Final diagnoses:  Screening examination for STD (sexually transmitted disease)    STD screening was performed, patient advised that the results be posted to their MyChart and if any of the results are positive, they will be notified by phone, further treatment will be provided as  indicated based on results of STD screening. Patient was advised to abstain from sexual intercourse until that they receive  the results of their STD testing.  Patient was also advised to use condoms to protect themselves from STD exposure. Return precautions advised.  Drug allergies reviewed, all questions addressed.     ED Prescriptions   None    PDMP not reviewed this encounter.  Disposition Upon Discharge:  Condition: stable for discharge home  Patient presented with concern for an acute illness with associated systemic symptoms and significant discomfort requiring urgent management. In my opinion, this is a condition that a prudent lay person (someone who possesses an average knowledge of health and medicine) may potentially expect to result in complications if not addressed urgently such as respiratory distress, impairment of bodily function or dysfunction of bodily organs.   As such, the patient has been evaluated and assessed, work-up was performed and treatment was provided in alignment with urgent care protocols and evidence based medicine.  Patient/parent/caregiver has been advised that the patient may require follow up for further testing and/or treatment if the symptoms continue in spite of treatment, as clinically indicated and appropriate.  Routine symptom specific, illness specific and/or disease specific instructions were discussed with the patient and/or caregiver at length.  Prevention strategies for avoiding STD exposure were also discussed.  The patient will follow up with their current PCP if and as advised. If the patient does not currently have a PCP we will assist them in obtaining one.   The patient may need specialty follow up if the symptoms continue, in spite of conservative treatment and management, for further workup, evaluation, consultation and treatment as clinically indicated and appropriate.  Patient/parent/caregiver verbalized understanding and agreement  of plan as discussed.  All questions were addressed during visit.  Please see discharge instructions below for further details of plan.  Discharge Instructions:   Discharge Instructions      The results of your STD testing today which screens for gonorrhea, chlamydia, and trichomonas will be made posted to your MyChart account once it is complete.  This typically takes 2 to 4 days.  Please abstain from sexual intercourse of any kind, vaginal, oral or anal, until you have received the results of your STD testing.     If any of your results are abnormal, you will receive a phone call regarding treatment.  Prescriptions, if any are needed, will be provided for you at your pharmacy.     Thank you for visiting urgent care today.  I appreciate the opportunity to participate in your care.       This office note has been dictated using Teaching laboratory technician.  Unfortunately, this method of dictation can sometimes lead to typographical or grammatical errors.  I apologize for your inconvenience in advance if this occurs.  Please do not hesitate to reach out to me if clarification is needed.       Theadora Rama Scales, PA-C 11/19/21 1630

## 2021-11-19 NOTE — Telephone Encounter (Signed)
Pt called an informed that his Lamictal is not detected in his bloodwork. Dr Delice Lesch would like to increase his dose, he is currently on 50mg  twice a day (taking 25mg  2 tabs BID), Dr Delice Lesch sent in a prescription for Lamotrigine 100mg : Take 1 tablet twice a day. Continue Depakote 500mg  2 tabs qhs. Would like to recheck Depakote and Lamictal levels in a month,

## 2021-11-19 NOTE — Telephone Encounter (Signed)
-----   Message from Cameron Sprang, MD sent at 11/15/2021 12:44 PM EDT ----- Pls let him know his Lamictal is not detected in his bloodwork. I would like to increase his dose, he is currently on 50mg  twice a day (taking 25mg  2 tabs BID), I sent in a prescription for Lamotrigine 100mg : Take 1 tablet twice a day. Continue Depakote 500mg  2 tabs qhs. Would like to recheck Depakote and Lamictal levels in a month, thanks

## 2021-11-22 LAB — CYTOLOGY, (ORAL, ANAL, URETHRAL) ANCILLARY ONLY
Chlamydia: NEGATIVE
Comment: NEGATIVE
Comment: NEGATIVE
Comment: NORMAL
Neisseria Gonorrhea: POSITIVE — AB
Trichomonas: NEGATIVE

## 2021-11-23 ENCOUNTER — Ambulatory Visit
Admission: EM | Admit: 2021-11-23 | Discharge: 2021-11-23 | Disposition: A | Discharge status: Home / Self Care | Payer: Medicare Other | Attending: Internal Medicine | Admitting: Internal Medicine

## 2021-11-23 ENCOUNTER — Telehealth (HOSPITAL_COMMUNITY): Payer: Self-pay | Admitting: Emergency Medicine

## 2021-11-23 DIAGNOSIS — A549 Gonococcal infection, unspecified: Secondary | ICD-10-CM

## 2021-11-23 MED ORDER — CEFTRIAXONE SODIUM 500 MG IJ SOLR
500.0000 mg | Freq: Once | INTRAMUSCULAR | Status: AC
  Administered 2021-11-23: 500 mg via INTRAMUSCULAR

## 2021-11-23 NOTE — ED Triage Notes (Signed)
Pt for treatment for STI-NAD-steady gait

## 2021-11-23 NOTE — Telephone Encounter (Signed)
Per protocol, patient will need treatment with IM Rocephin 500mg  for positive Gonorrhea.   Attempted to reach patient x 1, VM is not setup Results seen on mychart [facility] notified

## 2021-12-06 ENCOUNTER — Ambulatory Visit (INDEPENDENT_AMBULATORY_CARE_PROVIDER_SITE_OTHER): Payer: Medicare Other

## 2021-12-06 ENCOUNTER — Ambulatory Visit
Admission: EM | Admit: 2021-12-06 | Discharge: 2021-12-06 | Disposition: A | Discharge status: Home / Self Care | Payer: Medicare Other | Attending: Internal Medicine | Admitting: Internal Medicine

## 2021-12-06 DIAGNOSIS — S8991XA Unspecified injury of right lower leg, initial encounter: Secondary | ICD-10-CM

## 2021-12-06 DIAGNOSIS — W109XXA Fall (on) (from) unspecified stairs and steps, initial encounter: Secondary | ICD-10-CM

## 2021-12-06 DIAGNOSIS — M25561 Pain in right knee: Secondary | ICD-10-CM | POA: Diagnosis not present

## 2021-12-06 MED ORDER — PREDNISONE 20 MG PO TABS: 40.0000 mg | ORAL_TABLET | Freq: Every day | ORAL | 0 refills | Status: AC

## 2021-12-06 MED ORDER — KETOROLAC TROMETHAMINE 30 MG/ML IJ SOLN
30.0000 mg | Freq: Once | INTRAMUSCULAR | Status: AC
  Administered 2021-12-06: 30 mg via INTRAMUSCULAR

## 2021-12-06 NOTE — ED Provider Notes (Addendum)
[name]    CSN: [id number] Arrival date & time: 12/06/21  1120      History   Chief Complaint Chief Complaint  Patient presents with   right knee pop    HPI ATZEL [place] is a 31 y.o. male.   Patient here today for evaluation of possible right knee injury.  He reports that he was walking down the stairs today and felt a pop in his knee and has had pain since that time.  He notes that weightbearing causes worsening pain.  He has had some swelling to his knee.  He reports that pain is located to his anterior knee diffusely.  He denies any numbness or tingling.  The history is provided by the patient.    Past Medical History:  Diagnosis Date   Epilepsy ([place])    Seizures [facility])     Patient Active Problem List   Diagnosis Date Noted   Chronic pain of both shoulders 10/13/2020   Seizure ([place]) 08/28/2014    Past Surgical History:  Procedure Laterality Date   DEBRIDEMENT MANDIBLE Bilateral 03/13/2016   Procedure: I&D Bilateral  Mandible Fractures;  Surgeon: Diona Browner, DDS;  Location: [place];  Service: Oral Surgery;  Laterality: Bilateral;       Home Medications    Prior to Admission medications   Medication Sig Start Date End Date Taking? Authorizing Provider  predniSONE (DELTASONE) 20 MG tablet Take 2 tablets (40 mg total) by mouth daily with breakfast for 5 days. 12/06/21 12/11/21 Yes Francene Finders, PA-C  diclofenac (VOLTAREN) 75 MG EC tablet Take 1 tablet (75 mg total) by mouth 2 (two) times daily. 10/13/20   Leandrew Koyanagi, MD  divalproex (DEPAKOTE) 500 MG DR tablet TAKE 2 TABLETS BY MOUTH EVERY NIGHT 11/12/21   Cameron Sprang, MD  lamoTRIgine (LAMICTAL) 100 MG tablet Take 1 tablet (100 mg total) by mouth 2 (two) times daily. 11/15/21   Cameron Sprang, MD  naproxen (NAPROSYN) 500 MG tablet Take 1 tablet (500 mg total) by mouth 2 (two) times daily with a meal. 01/25/20   Camillia Herter, NP  tiZANidine (ZANAFLEX) 4 MG tablet Take 1 tablet (4 mg  total) by mouth every 12 (twelve) hours as needed for muscle spasms. 01/25/20   Camillia Herter, NP    Family History Family History  Problem Relation Age of Onset   Diabetes Neg Hx    Heart disease Neg Hx    Cancer Neg Hx     Social History Social History   Tobacco Use   Smoking status: Former    Years: 12.00    Types: Cigarettes   Smokeless tobacco: Never  Vaping Use   Vaping Use: Some days  Substance Use Topics   Alcohol use: Yes    Comment: "Once every weeks"   Drug use: Yes    Types: Marijuana     Allergies   Bee venom and Tomato   Review of Systems Review of Systems  Constitutional:  Negative for chills and fever.  Eyes:  Negative for discharge and redness.  Musculoskeletal:  Positive for arthralgias and joint swelling.  Neurological:  Negative for numbness.     Physical Exam Triage Vital Signs ED Triage Vitals  Enc Vitals Group     BP 12/06/21 1302 130/81     Pulse Rate 12/06/21 1302 79     Resp 12/06/21 1302 16     Temp 12/06/21 1302 97.6 F (36.4 C)  Temp Source 12/06/21 1302 Oral     SpO2 12/06/21 1302 97 %     Weight --      Height --      Head Circumference --      Peak Flow --      Pain Score 12/06/21 1303 10     Pain Loc --      Pain Edu? --      Excl. in GC? --    No data found.  Updated Vital Signs BP 130/81 (BP Location: Left Arm)   Pulse 79   Temp 97.6 F (36.4 C) (Oral)   Resp 16   SpO2 97%   Physical Exam Vitals and nursing note reviewed.  Constitutional:      General: He is not in acute distress.    Appearance: Normal appearance. He is not ill-appearing.  HENT:     Head: Normocephalic and atraumatic.  Eyes:     Conjunctiva/sclera: Conjunctivae normal.  Cardiovascular:     Rate and Rhythm: Normal rate.  Pulmonary:     Effort: Pulmonary effort is normal.  Musculoskeletal:     Comments: Mild diffuse swelling noted to anterior right knee. Normal extension and flexion of right knee with pain noted with full  extension. Antalgic gait noted  Neurological:     Mental Status: He is alert.  Psychiatric:        Mood and Affect: Mood normal.        Behavior: Behavior normal.        Thought Content: Thought content normal.      UC Treatments / Results  Labs (all labs ordered are listed, but only abnormal results are displayed) Labs Reviewed - No data to display  EKG   Radiology DG Knee Complete 4 Views Right  Result Date: 12/06/2021 CLINICAL DATA:  Felt pop on stairs in the right knee. Pain medial to patella. EXAM: RIGHT KNEE - COMPLETE 4+ VIEW COMPARISON:  None Available. FINDINGS: There is no acute fracture or dislocation. Bony alignment is normal. The joint spaces are preserved. The soft tissues are unremarkable. There is no effusion. IMPRESSION: Normal knee radiographs. Electronically Signed   By: Lesia Hausen M.D.   On: 12/06/2021 13:20    Procedures Procedures (including critical care time)  Medications Ordered in UC Medications  ketorolac (TORADOL) 30 MG/ML injection 30 mg (30 mg Intramuscular Given 12/06/21 1341)    Initial Impression / Assessment and Plan / UC Course  I have reviewed the triage vital signs and the nursing notes.  Pertinent labs & imaging results that were available during my care of the patient were reviewed by me and considered in my medical decision making (see chart for details).    Right knee xray within normal limits. Discussed that xray would not show soft tissue injury and that he would need to follow up with ortho if symptoms persist or worsen over the next few days. Steroid burst prescribed to start tomorrow after steroid injection for hopeful improvement of inflammation, pain and swelling. Knee brace applied for added support.   Final Clinical Impressions(s) / UC Diagnoses   Final diagnoses:  Injury of right knee, initial encounter   Discharge Instructions   None    ED Prescriptions     Medication Sig Dispense Auth. Provider   predniSONE  (DELTASONE) 20 MG tablet Take 2 tablets (40 mg total) by mouth daily with breakfast for 5 days. 10 tablet Tomi Bamberger, PA-C      PDMP not reviewed  this encounter.   Francene Finders, PA-C 12/06/21 1805    Francene Finders, PA-C 12/06/21 [phone]

## 2021-12-06 NOTE — ED Triage Notes (Signed)
Pt c/o walking on stairs yesterday and feeling a pop in his right knee and concerned it is not in place. Causing pain, swelling, and LROM.

## 2022-03-10 ENCOUNTER — Telehealth: Payer: Self-pay

## 2022-03-10 NOTE — Telephone Encounter (Signed)
Att to contact pt to schedule AWV w/PCP Durene Fruits, NP no ans lvm to call office to schedule

## 2022-05-17 ENCOUNTER — Telehealth: Payer: Self-pay | Admitting: Family

## 2022-05-17 NOTE — Telephone Encounter (Signed)
Contacted Brandon Guerrero to schedule their annual wellness visit. Appointment made for 05/26/22.  [name] AWV direct phone # [phone]

## 2022-05-26 ENCOUNTER — Ambulatory Visit: Payer: 59

## 2022-05-26 ENCOUNTER — Telehealth: Payer: Self-pay

## 2022-05-26 NOTE — Telephone Encounter (Signed)
This nurse attempted to call patient three times for AWV. Message left that we will call again to reschedule for another time. 

## 2022-06-03 ENCOUNTER — Encounter: Payer: Self-pay | Admitting: Neurology

## 2022-06-03 ENCOUNTER — Ambulatory Visit (INDEPENDENT_AMBULATORY_CARE_PROVIDER_SITE_OTHER): Payer: 59 | Admitting: Neurology

## 2022-06-03 VITALS — BP 123/79 | HR 80 | Ht 74.0 in | Wt 285.4 lb

## 2022-06-03 DIAGNOSIS — G40309 Generalized idiopathic epilepsy and epileptic syndromes, not intractable, without status epilepticus: Secondary | ICD-10-CM | POA: Diagnosis not present

## 2022-06-03 NOTE — Progress Notes (Signed)
NEUROLOGY FOLLOW UP OFFICE NOTE  Brandon Guerrero [id number] [birth year]  HISTORY OF PRESENT ILLNESS: I had the pleasure of seeing Brandon Guerrero in follow-up in the neurology clinic on 06/03/2022.  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 He is alone in the office today. On his last visit, he reported compliance issues leading to 3 seizures in June, levels were undetectable. He reported taking Lamotrigine 25mg  2 tabs BID and Depakote 500mg  2 tablets daily regularly since then, repeat level in 10/2021 showed Depakote level of 84, Lamictal level of <0.5. Instructed to increase Lamotrigine to 100mg  BID. He reports doing well on current regimen, no seizures since 07/02/21. He denies any staring/unresponsive episodes, gaps in time, olfactory/gustatory hallucinations, focal numbness/tingling/weakness, myoclonic jerks. No headaches, dizziness, vision changes, no falls. He is eating more healthy and working out. He gets 8 hours of sleep, the Depakote makes him fall asleep quickly.    History on Initial Assessment 01/02/2019: This is a pleasant 32 year old right-handed man presenting to establish care for epilepsy. Seizures started at age 87 or 1, he recalls the first seizure occurred on Christmas eve, no prior warning symptoms, he was witnessed to fall with possible convulsive activity. He had seen neurologist Dr. Marjory Lies and had tests which "could not determine what was causing the seizures." He was started on Levetiracetam which caused him to sleep more and eat less. He asked to switch to a different medication and states he has been on Depakote since then. He was initially having more seizures on the Levetiracetam, but feels that the Depakote has slowed them down. He thinks his last seizure was a year ago. He now gets a warning prior to his seizures, his vision gets kind of black, his mouth gets very dry, he gets sweaty and feels dizzy like he cannot stand straight.  He has violent thoughts of doing violent things. He sees his hand shaking a little bit. He would also feel this way if he misses medication. Seizure triggers include missing medication, sleep deprivation, and alcohol. He has stopped drinking alcohol. He has had nocturnal convulsions. Around 2-3 years ago, he fell out of bed and woke up on the floor from a seizure, injuring his right knee. No focal weakness after. He lives with his niece who has mentioned occasional staring spells, he notices that he also stares off into space for 2-3 minutes. He has occasional myoclonic jerks where he would drop things. He denies any olfactory/gustatory hallucinations, focal numbness/tingling/weakness. He takes the Depakote 500mg  2 tabs every morning because he would tend to forget the afternoon dose. He still feels drowsy and sluggish on the Depakote, with some nausea. He has had more headaches recently, with a tight pulling sensation in the frontal region lasting until he takes Tylenol. No associated vomiting. He would be sensitive to lights. No diplopia, dysarthria/dysphagia, neck pain, bowel/bladder dysfunction. He has a little back pain. He works as a Investment banker, operational.  Epilepsy Risk Factors:  He had a normal birth and early development.  There is no history of febrile convulsions, CNS infections such as meningitis/encephalitis, significant traumatic brain injury, neurosurgical procedures, or family history of seizures.  Prior AEDs: Levetiracetam Laboratory Data:  Lab Results  Component Value Date        VALPROATE 97 03/12/2018    PAST MEDICAL HISTORY: Past Medical History:  Diagnosis Date   Epilepsy ([facility])    Seizures ([facility])     MEDICATIONS: Current Outpatient Medications on  File Prior to Visit  Medication Sig Dispense Refill   diclofenac (VOLTAREN) 75 MG EC tablet Take 1 tablet (75 mg total) by mouth 2 (two) times daily. 30 tablet 2   divalproex (DEPAKOTE) 500 MG DR tablet TAKE 2 TABLETS BY MOUTH EVERY NIGHT 180  tablet 3   lamoTRIgine (LAMICTAL) 100 MG tablet Take 1 tablet (100 mg total) by mouth 2 (two) times daily. 180 tablet 3   naproxen (NAPROSYN) 500 MG tablet Take 1 tablet (500 mg total) by mouth 2 (two) times daily with a meal. 30 tablet 0   tiZANidine (ZANAFLEX) 4 MG tablet Take 1 tablet (4 mg total) by mouth every 12 (twelve) hours as needed for muscle spasms. 30 tablet 0   No current facility-administered medications on file prior to visit.    ALLERGIES: Allergies  Allergen Reactions   Bee Venom Swelling   Tomato Swelling    FAMILY HISTORY: Family History  Problem Relation Age of Onset   Diabetes Neg Hx    Heart disease Neg Hx    Cancer Neg Hx     SOCIAL HISTORY: Social History   Socioeconomic History   Marital status: Single    Spouse name: Not on file   Number of children: Not on file   Years of education: Not on file   Highest education level: Not on file  Occupational History   Not on file  Tobacco Use   Smoking status: Former    Years: 12    Types: Cigarettes   Smokeless tobacco: Never  Vaping Use   Vaping Use: Some days  Substance and Sexual Activity   Alcohol use: Yes    Comment: "Once every weeks"   Drug use: Yes    Types: Marijuana   Sexual activity: Not on file  Other Topics Concern   Not on file  Social History Narrative   Right handed      Highest level of edu- some college      One story home   Social Determinants of Health   Financial Resource Strain: Not on file  Food Insecurity: Not on file  Transportation Needs: Not on file  Physical Activity: Not on file  Stress: Not on file  Social Connections: Not on file  Intimate Partner Violence: Not on file     PHYSICAL EXAM: Vitals:   06/03/22 1219  BP: 123/79  Pulse: 80  SpO2: 97%   General: No acute distress Head:  Normocephalic/atraumatic Skin/Extremities: No rash, no edema Neurological Exam: alert and awake. No aphasia or dysarthria. Fund of knowledge is appropriate.  Attention and concentration are normal.   Cranial nerves: Pupils equal, round. Extraocular movements intact with no nystagmus. Visual fields full.  No facial asymmetry.  Motor: Bulk and tone normal, muscle strength 5/5 throughout with no pronator drift.   Finger to nose testing intact.  Gait narrow-based and steady, able to tandem walk adequately.  Romberg negative.   IMPRESSION: This is a pleasant 32 yo RH man with a history of seizures since age 32 suggestive of a generalized epilepsy. EEG normal. Longest seizure-free interval of 3 years. He had 3 seizures in one day in June 2023 in setting of medication compliance issues. He is on Lamotrigine 100mg  BID and Depakote 500mg : 2 tabs qhs (1000mg  qhs), baseline Lamictal and Depakote levels will be checked. We again discussed avoidance of seizure triggers, including missing medication, sleep deprivation, and alcohol. He is aware of [place] driving laws to stop driving after a seizure until 6  months seizure-free. Follow-up in 6 months, call for any changes.    Thank you for allowing me to participate in his care.  Please do not hesitate to call for any questions or concerns.    Patrcia Dolly, M.D.   CC: Ricky Stabs, NP
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
## 2022-10-01 ENCOUNTER — Ambulatory Visit (HOSPITAL_COMMUNITY)
Admission: EM | Admit: 2022-10-01 | Discharge: 2022-10-01 | Disposition: A | Discharge status: Home / Self Care | Payer: 59 | Attending: Family Medicine | Admitting: Family Medicine

## 2022-10-01 ENCOUNTER — Encounter (HOSPITAL_COMMUNITY): Payer: Self-pay

## 2022-10-01 ENCOUNTER — Ambulatory Visit (INDEPENDENT_AMBULATORY_CARE_PROVIDER_SITE_OTHER): Payer: 59

## 2022-10-01 ENCOUNTER — Ambulatory Visit (HOSPITAL_COMMUNITY): Admission: EM | Admit: 2022-10-01 | Discharge: 2022-10-01 | Discharge status: Against Medical Advice | Payer: 59

## 2022-10-01 DIAGNOSIS — L0291 Cutaneous abscess, unspecified: Secondary | ICD-10-CM

## 2022-10-01 DIAGNOSIS — M25561 Pain in right knee: Secondary | ICD-10-CM | POA: Diagnosis not present

## 2022-10-01 DIAGNOSIS — L0231 Cutaneous abscess of buttock: Secondary | ICD-10-CM | POA: Diagnosis not present

## 2022-10-01 MED ORDER — DOXYCYCLINE HYCLATE 100 MG PO CAPS: 100.0000 mg | ORAL_CAPSULE | Freq: Two times a day (BID) | ORAL | 0 refills | Status: AC

## 2022-10-01 MED ORDER — OXYCODONE-ACETAMINOPHEN 5-325 MG PO TABS: 1.0000 | ORAL_TABLET | Freq: Three times a day (TID) | ORAL | 0 refills | Status: AC | PRN

## 2022-10-01 MED ORDER — LIDOCAINE-EPINEPHRINE 1 %-1:100000 IJ SOLN
INTRAMUSCULAR | Status: AC
  Filled 2022-10-01: qty 1

## 2022-10-01 NOTE — ED Notes (Signed)
T/C to patient; pt verified he is in the parking lot of [facility], notified that we have an exam room ready for him now. Pt verbalized understanding.

## 2022-10-01 NOTE — ED Notes (Signed)
No response from waiting area. RN went to parking lot -- no noticeable movement of individuals attempting to come into [facility] from a vehicle. An additional T/C to patient without response. Patient Access intake notified.

## 2022-10-01 NOTE — ED Provider Notes (Signed)
[facility]    CSN: [id number] Arrival date & time: 10/01/22  1257      History   Chief Complaint Chief Complaint  Patient presents with   Abscess    HPI Brandon Guerrero is a 32 y.o. male.   The history is provided by the patient. No language interpreter was used.  Knee Pain Location:  Knee Time since incident: Left knee twisting this morning while in the shower trying to feel his gluteal abscess. Knee location:  R knee Pain details:    Quality: Sharp pain like a knot. He felt his knee bone fell out of place.   Severity:  Severe   Onset quality:  Sudden   Timing:  Constant Dislocation: He felt there might have been some knee dislocation..   Relieved by:  Nothing Worsened by:  Rotation and bearing weight Associated symptoms: decreased ROM   Associated symptoms: no fever   Abscess Abscess location: Anal abscess. Abscess quality: painful   Abscess quality: not draining, no fluctuance and no redness   Red streaking: no   Duration: This morning. Pain details:    Quality:  Pressure, sharp and throbbing   Severity:  Severe   Duration: He was woken up from sleep this morning with severe anal pain. However, he started experiencing some discomfort with bowel movement a few days earlier.   Timing:  Constant   Progression:  Worsening Chronicity:  New Relieved by:  Nothing Exacerbated by: touch. Associated symptoms: no fever      Past Medical History:  Diagnosis Date   Epilepsy (HCC)    Seizures [facility])     Patient Active Problem List   Diagnosis Date Noted   Chronic pain of both shoulders 10/13/2020   Seizure (HCC) 08/28/2014    Past Surgical History:  Procedure Laterality Date   DEBRIDEMENT MANDIBLE Bilateral 03/13/2016   Procedure: I&D Bilateral  Mandible Fractures;  Surgeon: Ocie Doyne, DDS;  Location: [facility] OR;  Service: Oral Surgery;  Laterality: Bilateral;       Home Medications    Prior to Admission medications   Medication Sig Start Date  End Date Taking? Authorizing Provider  doxycycline (VIBRAMYCIN) 100 MG capsule Take 1 capsule (100 mg total) by mouth 2 (two) times daily. 10/01/22  Yes Doreene Eland, MD  oxyCODONE-acetaminophen (PERCOCET/ROXICET) 5-325 MG tablet Take 1 tablet by mouth every 8 (eight) hours as needed for up to 7 days for severe pain. 10/01/22 10/08/22 Yes Doreene Eland, MD  diclofenac (VOLTAREN) 75 MG EC tablet Take 1 tablet (75 mg total) by mouth 2 (two) times daily. 10/13/20   Tarry Kos, MD  divalproex (DEPAKOTE) 500 MG DR tablet TAKE 2 TABLETS BY MOUTH EVERY NIGHT 11/12/21   Van Clines, MD  lamoTRIgine (LAMICTAL) 100 MG tablet Take 1 tablet (100 mg total) by mouth 2 (two) times daily. 11/15/21   Van Clines, MD  naproxen (NAPROSYN) 500 MG tablet Take 1 tablet (500 mg total) by mouth 2 (two) times daily with a meal. 01/25/20   Rema Fendt, NP  tiZANidine (ZANAFLEX) 4 MG tablet Take 1 tablet (4 mg total) by mouth every 12 (twelve) hours as needed for muscle spasms. 01/25/20   Rema Fendt, NP    Family History Family History  Problem Relation Age of Onset   Diabetes Neg Hx    Heart disease Neg Hx    Cancer Neg Hx     Social History Social History   Tobacco Use  Smoking status: Former    Types: Cigarettes   Smokeless tobacco: Never  Vaping Use   Vaping status: Some Days  Substance Use Topics   Alcohol use: Yes    Comment: "Once every weeks"   Drug use: Yes    Types: Marijuana     Allergies   Bee venom and Tomato   Review of Systems Review of Systems  Constitutional:  Negative for fever.  All other systems reviewed and are negative.    Physical Exam Triage Vital Signs ED Triage Vitals  Encounter Vitals Group     BP      Systolic BP Percentile      Diastolic BP Percentile      Pulse      Resp      Temp      Temp src      SpO2      Weight      Height      Head Circumference      Peak Flow      Pain Score      Pain Loc      Pain Education      Exclude  from Growth Chart    No data found.  Updated Vital Signs BP 127/85 (BP Location: Left Arm)   Pulse 80   Temp 98.3 F (36.8 C) (Oral)   Resp 16   Ht 6\' 2"  (1.88 m)   SpO2 94%   BMI 36.64 kg/m   Visual Acuity Right Eye Distance:   Left Eye Distance:   Bilateral Distance:    Right Eye Near:   Left Eye Near:    Bilateral Near:     Physical Exam Vitals and nursing note reviewed. Exam conducted with a chaperone present [facility]).  Cardiovascular:     Rate and Rhythm: Normal rate and regular rhythm.     Heart sounds: No murmur heard. Pulmonary:     Effort: Pulmonary effort is normal. No respiratory distress.     Breath sounds: Normal breath sounds. No wheezing.  Genitourinary:    Comments: Firm, very tender, non erythematous, nonfluctuant nodule on his left gluteal cleft, close to his anal orifice, but not on his anus Musculoskeletal:     Right knee: No swelling. Decreased range of motion. Tenderness present.     Left knee: Normal.     Comments: Mild bony protrusion on the medial aspect of his right knee      UC Treatments / Results  Labs (all labs ordered are listed, but only abnormal results are displayed) Labs Reviewed - No data to display  EKG   Radiology DG Knee Complete 4 Views Right  Result Date: 10/01/2022 CLINICAL DATA:  Fall from standing with knee pain. EXAM: RIGHT KNEE - COMPLETE 4+ VIEW COMPARISON:  Knee radiographs dated 12/14/2021. FINDINGS: No evidence of fracture, dislocation, or joint effusion. No evidence of arthropathy or other focal bone abnormality. Soft tissues are unremarkable. IMPRESSION: Negative. Electronically Signed   By: Romona Curls M.D.   On: 10/01/2022 14:04    Procedures Incision and Drainage  Date/Time: 10/01/2022 1:39 PM  Performed by: Doreene Eland, MD Authorized by: Doreene Eland, MD   Consent:    Consent obtained:  Verbal   Consent given by:  Patient   Risks, benefits, and alternatives were discussed: yes     Risks  discussed:  Bleeding, pain, infection and damage to other organs   Alternatives discussed:  Alternative treatment Universal protocol:    Procedure  explained and questions answered to patient or proxy's satisfaction: yes     Site/side marked: yes     Patient identity confirmed:  Verbally with patient Location:    Type:  Abscess (Gluteal/Perianal)   Size:  2 mm   Location:  Anogenital   Anogenital location:  Gluteal cleft (Close to his anus, but not on the anus) Sedation:    Sedation type:  None Anesthesia:    Anesthesia method:  Local infiltration   Local anesthetic:  Lidocaine 2% WITH epi Procedure type:    Complexity:  Simple Procedure details:    Incision types:  Stab incision   Drainage:  Bloody   Drainage amount:  Scant   Wound treatment:  Wound left open (Pressure dressing applied) Post-procedure details:    Procedure completion:  Tolerated with difficulty Comments:     No postprocedure complications  (including critical care time)  Medications Ordered in UC Medications - No data to display  Initial Impression / Assessment and Plan / UC Course  I have reviewed the triage vital signs and the nursing notes.  Pertinent labs & imaging results that were available during my care of the patient were reviewed by me and considered in my medical decision making (see chart for details).  Clinical Course as of 10/01/22 1454  Sat Oct 01, 2022  1339 Anal/gluteal abscess Option given to treat with Pain meds and antibiotic without I&D today given that the abscess is firm and not fluctuant He prefers I&D today Risk and benefit discussed Verbal consent obtained See procedure note Doxycycline and Oxycodone prescribed  Pressure dressing applied post procedure F/U as needed See PCP in the coming week for reassessment  [KE]  1344 Knee pain Xray with no dislocation or fracture Use Oxycodone prn as prescribed [KE]    Clinical Course User Index [KE] Doreene Eland, MD     Final Clinical Impressions(s) / UC Diagnoses   Final diagnoses:  Acute pain of right knee     Discharge Instructions      It was nice seeing you today. Sorry about your anal abscess and pain. We were able to drain the abscess. Please start oral antibiotic and pain medication as discussed You can take a sitz bath twice daily to help with healing and pain relieve. See your PCP in the coming week for follow up. Your knee xray is negative for dislocation or fracture. Please use Oxycodone as needed for pain.    ED Prescriptions     Medication Sig Dispense Auth. Provider   oxyCODONE-acetaminophen (PERCOCET/ROXICET) 5-325 MG tablet Take 1 tablet by mouth every 8 (eight) hours as needed for up to 7 days for severe pain. 15 tablet Janit Pagan T, MD   doxycycline (VIBRAMYCIN) 100 MG capsule Take 1 capsule (100 mg total) by mouth 2 (two) times daily. 20 capsule Doreene Eland, MD      I have reviewed the PDMP during this encounter.   Doreene Eland, MD 10/01/22 [phone]

## 2022-10-01 NOTE — ED Triage Notes (Signed)
Patient here today with c/o abscess on anus that started this morning. Patient states that he cannot get comfortable.   Patient states that while he was taking a shower this morning, he slipped in the shower and hurt his right knee.

## 2022-10-01 NOTE — Discharge Instructions (Addendum)
It was nice seeing you today. Sorry about your anal abscess and pain. We were able to drain the abscess. Please start oral antibiotic and pain medication as discussed You can take a sitz bath twice daily to help with healing and pain relieve. See your PCP in the coming week for follow up. Your knee xray is negative for dislocation or fracture. Please use Oxycodone as needed for pain.

## 2022-10-17 ENCOUNTER — Other Ambulatory Visit: Payer: Self-pay

## 2022-10-17 ENCOUNTER — Ambulatory Visit
Admission: RE | Admit: 2022-10-17 | Discharge: 2022-10-17 | Disposition: A | Discharge status: Home / Self Care | Payer: 59 | Source: Ambulatory Visit | Attending: Internal Medicine | Admitting: Internal Medicine

## 2022-10-17 VITALS — BP 114/77 | HR 86 | Temp 98.3°F | Resp 18

## 2022-10-17 DIAGNOSIS — K645 Perianal venous thrombosis: Secondary | ICD-10-CM | POA: Diagnosis not present

## 2022-10-17 MED ORDER — ACETAMINOPHEN-CODEINE 300-30 MG PO TABS: 1.0000 | ORAL_TABLET | Freq: Four times a day (QID) | ORAL | 0 refills | Status: AC | PRN

## 2022-10-17 MED ORDER — LIDOCAINE 5 % EX OINT: 1.0000 | TOPICAL_OINTMENT | CUTANEOUS | 0 refills | Status: AC | PRN

## 2022-10-17 MED ORDER — HYDROCORTISONE (PERIANAL) 2.5 % EX CREA: 1.0000 | TOPICAL_CREAM | Freq: Two times a day (BID) | CUTANEOUS | 0 refills | Status: AC

## 2022-10-17 NOTE — ED Provider Notes (Signed)
[name]    CSN: [id number] Arrival date & time: 10/17/22  1338      History   Chief Complaint Chief Complaint  Patient presents with   Abscess    Entered by patient   Wound Check    Follow up abscess I&D    HPI Brandon Guerrero is a 32 y.o. male.   32 year old male who presents to urgent care with complaints of residual perirectal abscess on the left at the 10 to 11 o'clock position.  He was seen on September 14 for the same issue and had a stab I&D performed and was given doxycycline.  He reports that the area drained but within a few days started to fill back up and become painful again.  He reports it is now more painful than it was previous.  He is having significant difficulty sitting and with having bowel movements as the year is pain next to the rectum.  He denies any fevers or chills.  He denies any blood in his stool.  He denies any history of this in the past.     Abscess Associated symptoms: no fever and no vomiting   Wound Check Pertinent negatives include no chest pain, no abdominal pain and no shortness of breath.    Past Medical History:  Diagnosis Date   Epilepsy (HCC)    Seizures [facility])     Patient Active Problem List   Diagnosis Date Noted   Chronic pain of both shoulders 10/13/2020   Seizure (HCC) 08/28/2014    Past Surgical History:  Procedure Laterality Date   DEBRIDEMENT MANDIBLE Bilateral 03/13/2016   Procedure: I&D Bilateral  Mandible Fractures;  Surgeon: Ocie Doyne, DDS;  Location: [facility] OR;  Service: Oral Surgery;  Laterality: Bilateral;       Home Medications    Prior to Admission medications   Medication Sig Start Date End Date Taking? Authorizing Provider  divalproex (DEPAKOTE) 500 MG DR tablet TAKE 2 TABLETS BY MOUTH EVERY NIGHT 11/12/21  Yes Van Clines, MD  doxycycline (VIBRAMYCIN) 100 MG capsule Take 1 capsule (100 mg total) by mouth 2 (two) times daily. 10/01/22  Yes Doreene Eland, MD  lamoTRIgine (LAMICTAL)  100 MG tablet Take 1 tablet (100 mg total) by mouth 2 (two) times daily. 11/15/21  Yes Van Clines, MD  diclofenac (VOLTAREN) 75 MG EC tablet Take 1 tablet (75 mg total) by mouth 2 (two) times daily. 10/13/20   Tarry Kos, MD  naproxen (NAPROSYN) 500 MG tablet Take 1 tablet (500 mg total) by mouth 2 (two) times daily with a meal. 01/25/20   Rema Fendt, NP  tiZANidine (ZANAFLEX) 4 MG tablet Take 1 tablet (4 mg total) by mouth every 12 (twelve) hours as needed for muscle spasms. 01/25/20   Rema Fendt, NP    Family History Family History  Problem Relation Age of Onset   Diabetes Neg Hx    Heart disease Neg Hx    Cancer Neg Hx     Social History Social History   Tobacco Use   Smoking status: Former    Types: Cigarettes   Smokeless tobacco: Never  Vaping Use   Vaping status: Some Days  Substance Use Topics   Alcohol use: Yes    Comment: "Once every weeks"   Drug use: Yes    Types: Marijuana     Allergies   Bee venom and Tomato   Review of Systems Review of Systems  Constitutional:  Negative  for chills and fever.  HENT:  Negative for ear pain and sore throat.   Eyes:  Negative for pain and visual disturbance.  Respiratory:  Negative for cough and shortness of breath.   Cardiovascular:  Negative for chest pain and palpitations.  Gastrointestinal:  Negative for abdominal pain and vomiting.       Perirectal abscess at the 10 to 11 o'clock position on the left  Genitourinary:  Negative for dysuria and hematuria.  Musculoskeletal:  Negative for arthralgias and back pain.  Skin:  Negative for color change and rash.  Neurological:  Negative for seizures and syncope.  All other systems reviewed and are negative.    Physical Exam Triage Vital Signs ED Triage Vitals  Encounter Vitals Group     BP 10/17/22 1415 114/77     Systolic BP Percentile --      Diastolic BP Percentile --      Pulse Rate 10/17/22 1415 86     Resp 10/17/22 1415 18     Temp 10/17/22 1415  98.3 F (36.8 C)     Temp Source 10/17/22 1415 Oral     SpO2 10/17/22 1415 98 %     Weight --      Height --      Head Circumference --      Peak Flow --      Pain Score 10/17/22 1414 10     Pain Loc --      Pain Education --      Exclude from Growth Chart --    No data found.  Updated Vital Signs BP 114/77 (BP Location: Left Arm)   Pulse 86   Temp 98.3 F (36.8 C) (Oral)   Resp 18   SpO2 98%   Visual Acuity Right Eye Distance:   Left Eye Distance:   Bilateral Distance:    Right Eye Near:   Left Eye Near:    Bilateral Near:     Physical Exam Vitals and nursing note reviewed.  Constitutional:      General: He is not in acute distress.    Appearance: He is well-developed.  HENT:     Head: Normocephalic and atraumatic.  Eyes:     Conjunctiva/sclera: Conjunctivae normal.  Cardiovascular:     Rate and Rhythm: Normal rate and regular rhythm.     Heart sounds: No murmur heard. Pulmonary:     Effort: Pulmonary effort is normal. No respiratory distress.     Breath sounds: Normal breath sounds.  Abdominal:     Palpations: Abdomen is soft.     Tenderness: There is no abdominal tenderness.  Genitourinary:   Musculoskeletal:        General: No swelling.     Cervical back: Neck supple.  Skin:    General: Skin is warm and dry.     Capillary Refill: Capillary refill takes less than 2 seconds.  Neurological:     Mental Status: He is alert.  Psychiatric:        Mood and Affect: Mood normal.     UC Treatments / Results  Labs (all labs ordered are listed, but only abnormal results are displayed) Labs Reviewed - No data to display  EKG   Radiology No results found.  Procedures Procedures (including critical care time)  Discussed procedure with patient including risk, benefits and alternatives including bleeding, infection, risk of recurrence, damage to other structures.  Patient agreed and expressed understanding.  After informed consent, area was cleansed  with Betadine  and 2% lidocaine with epinephrine was injected into the area.  Once adequate anesthesia was reached an incision was made over the area and small amount of blood and blood clots were excised.  Patient was having difficulty tolerating the procedure and was moving significantly as well as clenching his buttocks making it very difficult to completely excise the entire hemorrhoid.  I discussed this with the patient but he was still unable to tolerate the procedure completely.  As much thrombosis as possible was excised and a packing was placed over top.  Wound care instructions given to the patient.  Patient tolerated only small portion of the procedure without difficulties.  No complications were experienced.  Medications Ordered in UC Medications - No data to display  Initial Impression / Assessment and Plan / UC Course  I have reviewed the triage vital signs and the nursing notes.  Pertinent labs & imaging results that were available during my care of the patient were reviewed by me and considered in my medical decision making (see chart for details).     Thrombosed external hemorrhoid: After being able to better evaluate the area, this area is actually a thrombosed hemorrhoid not a perirectal abscess.  Excision of thrombosed hemorrhoid was attempted however due to patient discomfort and movement it was only partially excised.  Discussed with the patient conservative management including sitz bath's, hydrocortisone cream and softening his bowel movements.  Discussed that he has a high likelihood of recurrence and that he may need to see a surgeon if this occurs.  Will give him a short course of Tylenol 3 but advised him that this can cause constipation and that he should avoid using this unless necessary.  Other instructions given to the patient.  Return to urgent care if needed. Final Clinical Impressions(s) / UC Diagnoses   Final diagnoses:  None   Discharge Instructions   None     ED Prescriptions   None    PDMP not reviewed this encounter.   Landis Martins, [place] 10/17/22 1535

## 2022-10-17 NOTE — ED Triage Notes (Signed)
Pt here s/p abscess to left gluteal cleft I&D at [facility] on 9/14. States still having pain and is swelling again

## 2022-10-17 NOTE — Discharge Instructions (Addendum)
The area is actually a hemorrhoid not a perirectal abscess.  These have a higher likelihood of recurrence.  Will start the following treatment Remove gauze in 2 to 3 hours and do a sitz bath with warm to hot soapy water for 10 to 15 minutes.  Repeat sitz bath's 2-3 times daily and as needed after bowel movements Start topical hydrocortisone 3 times daily to the area of most tenderness May use lidocaine gel 3-4 times daily as needed for pain Start MiraLAX 1 capful twice daily.  If continuing to have hard or infrequent stools increase to 2 capfuls twice daily until regular bowel movements occur Start fiber supplement daily to help with keeping bowel movements regular Plan follow-up with [place] [facility] if the area recurs May return to urgent care if needed

## 2022-11-23 ENCOUNTER — Ambulatory Visit: Payer: 59

## 2022-12-09 ENCOUNTER — Other Ambulatory Visit: Payer: Self-pay | Admitting: Neurology

## 2023-01-06 ENCOUNTER — Ambulatory Visit: Payer: 59 | Admitting: Neurology

## 2023-01-07 ENCOUNTER — Other Ambulatory Visit: Payer: Self-pay | Admitting: Neurology

## 2023-01-09 ENCOUNTER — Encounter: Payer: Self-pay | Admitting: Neurology

## 2023-01-12 ENCOUNTER — Other Ambulatory Visit: Payer: Self-pay

## 2023-01-12 MED ORDER — DIVALPROEX SODIUM 500 MG PO DR TAB: DELAYED_RELEASE_TABLET | ORAL | 1 refills | Status: DC
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
## 2023-08-02 ENCOUNTER — Other Ambulatory Visit: Payer: Self-pay | Admitting: Neurology

## 2023-08-08 ENCOUNTER — Ambulatory Visit: Payer: 59 | Admitting: Neurology

## 2023-08-31 ENCOUNTER — Other Ambulatory Visit: Payer: Self-pay | Admitting: Neurology

## 2023-09-14 ENCOUNTER — Ambulatory Visit
Admission: EM | Admit: 2023-09-14 | Discharge: 2023-09-14 | Disposition: A | Discharge status: Home / Self Care | Attending: Physician Assistant | Admitting: Physician Assistant

## 2023-09-14 ENCOUNTER — Other Ambulatory Visit: Payer: Self-pay

## 2023-09-14 DIAGNOSIS — M549 Dorsalgia, unspecified: Secondary | ICD-10-CM

## 2023-09-14 LAB — POCT URINE DIPSTICK
Bilirubin, UA: NEGATIVE
Blood, UA: NEGATIVE
Glucose, UA: NEGATIVE mg/dL
Ketones, POC UA: NEGATIVE mg/dL
Leukocytes, UA: NEGATIVE
Nitrite, UA: NEGATIVE
POC PROTEIN,UA: NEGATIVE
Spec Grav, UA: 1.02 (ref 1.010–1.025)
Urobilinogen, UA: 0.2 U/dL
pH, UA: 7 (ref 5.0–8.0)

## 2023-09-14 MED ORDER — KETOROLAC TROMETHAMINE 30 MG/ML IJ SOLN
30.0000 mg | Freq: Once | INTRAMUSCULAR | Status: AC
  Administered 2023-09-14: 30 mg via INTRAMUSCULAR

## 2023-09-14 MED ORDER — METHOCARBAMOL 500 MG PO TABS: 500.0000 mg | ORAL_TABLET | Freq: Two times a day (BID) | ORAL | 0 refills | Status: AC

## 2023-09-14 NOTE — ED Triage Notes (Addendum)
 Pt presents with a chief complaint of back pain x 2 days. Pt currently rates his overall pain a 10/10. Describes the pain as sharp and shooting. Having pains in the back of both legs as well, pain is radiating down. OTC Ibuprofen  taken for pain with no improvement/relief. Pt looks visibly uncomfortable in triage room. No known injuries.

## 2023-09-14 NOTE — ED Provider Notes (Addendum)
 [facility]    CSN: [id number] Arrival date & time: 09/14/23  1113      History   Chief Complaint Chief Complaint  Patient presents with   Back Pain    HPI Brandon Guerrero is a 33 y.o. male.  has a past medical history of Epilepsy (HCC) and Seizures (HCC).   HPI  Pt reports concerns for severe back pain that starts at both shoulders and travels downward to the lower back He reports pain also travels into both legs to the level of the knee He denies numbness or tingling of extremities, incontinence, saddle anesthesia He denies recent injuries or trauma. He reports that he exercises regularly but denies overexertion or concerns for over doing it. Pain level and character: 10/10 and stabbing in nature Aggravating: sitting for too long- states he feels better with getting up and walking around  Alleviating: walking and stretches Interventions: Tylenol  and ibuprofen , he has tried muscle relaxer as well but this did not provide relief   He is still under active management for seizures- states his last one was last year to the best of his knowledge    Past Medical History:  Diagnosis Date   Epilepsy (HCC)    Seizures (HCC)     Patient Active Problem List   Diagnosis Date Noted   Chronic pain of both shoulders 10/13/2020   Seizure (HCC) 08/28/2014    Past Surgical History:  Procedure Laterality Date   DEBRIDEMENT MANDIBLE Bilateral 03/13/2016   Procedure: I&D Bilateral  Mandible Fractures;  Surgeon: Glendia Primrose, DDS;  Location: [facility] OR;  Service: Oral Surgery;  Laterality: Bilateral;       Home Medications    Prior to Admission medications   Medication Sig Start Date End Date Taking? Authorizing Provider  methocarbamol  (ROBAXIN ) 500 MG tablet Take 1 tablet (500 mg total) by mouth 2 (two) times daily. 09/14/23  Yes Ronzell Laban E, PA-C  acetaminophen -codeine  (TYLENOL  #3) 300-30 MG tablet Take 1-2 tablets by mouth every 6 (six) hours as needed for moderate pain.  10/17/22   White, Elizabeth A, PA-C  diclofenac  (VOLTAREN ) 75 MG EC tablet Take 1 tablet (75 mg total) by mouth 2 (two) times daily. 10/13/20   Jerri Kay HERO, MD  divalproex  (DEPAKOTE ) 500 MG DR tablet TAKE 2 TABLETS BY MOUTH EVERY NIGHT 08/02/23   Georjean Darice HERO, MD  doxycycline  (VIBRAMYCIN ) 100 MG capsule Take 1 capsule (100 mg total) by mouth 2 (two) times daily. 10/01/22   Anders Otto ONEIDA, MD  hydrocortisone  (ANUSOL -HC) 2.5 % rectal cream Place 1 Application rectally 2 (two) times daily. 10/17/22   White, Elizabeth A, PA-C  lamoTRIgine  (LAMICTAL ) 100 MG tablet Take 1 tablet (100 mg total) by mouth 2 (two) times daily. 11/15/21   Georjean Darice HERO, MD  lidocaine  (XYLOCAINE ) 5 % ointment Apply 1 Application topically as needed. 10/17/22   White, Elizabeth A, PA-C  naproxen  (NAPROSYN ) 500 MG tablet Take 1 tablet (500 mg total) by mouth 2 (two) times daily with a meal. 01/25/20   Lorren Greig PARAS, NP  tiZANidine  (ZANAFLEX ) 4 MG tablet Take 1 tablet (4 mg total) by mouth every 12 (twelve) hours as needed for muscle spasms. 01/25/20   Lorren Greig PARAS, NP    Family History Family History  Problem Relation Age of Onset   Diabetes Neg Hx    Heart disease Neg Hx    Cancer Neg Hx     Social History Social History   Tobacco Use  Smoking status: Former    Types: Cigarettes   Smokeless tobacco: Never  Vaping Use   Vaping status: Some Days  Substance Use Topics   Alcohol use: Yes    Comment: Once every weeks   Drug use: Yes    Types: Marijuana     Allergies   Bee venom and Tomato   Review of Systems Review of Systems  Constitutional:  Negative for chills and fever.  Musculoskeletal:  Positive for back pain and myalgias.  Neurological:  Negative for dizziness, weakness, light-headedness, numbness and headaches.     Physical Exam Triage Vital Signs ED Triage Vitals  Encounter Vitals Group     BP 09/14/23 1215 117/76     Girls Systolic BP Percentile --      Girls Diastolic BP  Percentile --      Boys Systolic BP Percentile --      Boys Diastolic BP Percentile --      Pulse Rate 09/14/23 1215 81     Resp 09/14/23 1215 19     Temp 09/14/23 1215 98.6 F (37 C)     Temp Source 09/14/23 1215 Oral     SpO2 09/14/23 1215 96 %     Weight 09/14/23 1217 280 lb (127 kg)     Height 09/14/23 1217 6' 2 (1.88 m)     Head Circumference --      Peak Flow --      Pain Score 09/14/23 1217 10     Pain Loc --      Pain Education --      Exclude from Growth Chart --    No data found.  Updated Vital Signs BP 117/76 (BP Location: Right Arm)   Pulse 81   Temp 98.6 F (37 C) (Oral)   Resp 19   Ht 6' 2 (1.88 m)   Wt 280 lb (127 kg)   SpO2 96%   BMI 35.95 kg/m   Visual Acuity Right Eye Distance:   Left Eye Distance:   Bilateral Distance:    Right Eye Near:   Left Eye Near:    Bilateral Near:     Physical Exam Vitals reviewed.  Constitutional:      General: He is awake.     Appearance: Normal appearance. He is well-developed and well-groomed.  HENT:     Head: Normocephalic and atraumatic.  Eyes:     Extraocular Movements: Extraocular movements intact.     Conjunctiva/sclera: Conjunctivae normal.  Pulmonary:     Effort: Pulmonary effort is normal.  Musculoskeletal:     Cervical back: Normal range of motion.     Comments: ROM findings Shoulder: ROM is intact with regards to flexion, extension, abduction, adduction, internal and external rotation. Thoracic: Lateral flexion and lateral rotation are intact and symmetrical. He reports pain with lateral rotation on the opposite sides- states there is a pulling sensation  Lumbar: Extension, flexion are intact. He reports pain with extension and flexion  Hips: Extension, flexion, abduction, adduction are symmetrical. Pt has difficulty with extension from the hip     Neurological:     Mental Status: He is alert and oriented to person, place, and time.  Psychiatric:        Attention and Perception: Attention  normal.        Mood and Affect: Mood normal.        Speech: Speech normal.        Behavior: Behavior normal. Behavior is cooperative.  UC Treatments / Results  Labs (all labs ordered are listed, but only abnormal results are displayed) Labs Reviewed  POCT URINE DIPSTICK - Abnormal; Notable for the following components:      Result Value   Color, UA straw (*)    All other components within normal limits  CBC  COMPREHENSIVE METABOLIC PANEL WITH GFR  CK    EKG   Radiology No results found.  Procedures Procedures (including critical care time)  Medications Ordered in UC Medications  ketorolac  (TORADOL ) 30 MG/ML injection 30 mg (30 mg Intramuscular Given 09/14/23 1312)    Initial Impression / Assessment and Plan / UC Course  I have reviewed the triage vital signs and the nursing notes.  Pertinent labs & imaging results that were available during my care of the patient were reviewed by me and considered in my medical decision making (see chart for details).      Final Clinical Impressions(s) / UC Diagnoses   Final diagnoses:  Acute bilateral back pain, unspecified back location    Patient presents today with concerns for severe back pain that starts at the shoulders and travels downwards to the lumbar spine.  He does report pain radiating into both legs to the level of the knee and denies numbness or tingling of extremities, incontinence, saddle anesthesia.  Given severity of pain I checked a urine dip to assess for potential kidney stones but this was negative for abnormality.  Will also check CBC, CMP, creatine kinase for abnormality and potential contributory etiology such as electrolyte abnormality, rhabdomyolysis.  Physical exam is largely reassuring without signs of reduced range of motion of thoracic, lumbar spines or the hips.  No obvious spasms or tension noted along thoracic or lumbar spines in the areas of concern.  Vitals are largely reassuring.  At this time  suspect potential muscular strain.  Will provide Toradol  30 mg injection today and reviewed further symptomatic relief for ongoing management.  Instructions were also provided in AVS.  Recommend follow-up with PCP if symptoms are not improving or seem to be worsening.  Follow-up as needed.    Discharge Instructions      You were seen today for concerns of acute back pain. Based on your symptoms and physical exam I believe the following is the cause of your concern today Back pain likely secondary to a strain of your back muscles  I recommend the following at this time to help relieve that discomfort:  Rest Warm compresses to the area (20 minutes on, minimum of 30 minutes off) You can alternate Tylenol  and Ibuprofen  for pain management but Ibuprofen  is typically preferred to reduce inflammation.  We have provided you with a Toradol  30 mg injection in clinic today.  This medication is an NSAID so over the next 2 days I recommend only using Tylenol  to assist with pain control I am also sending in a muscle relaxer called Robaxin  for you to use as needed up to 3 times per day.  Please be advised that muscle relaxers can cause drowsiness and sedation so do not use this if you need to remain alert or drive Gentle stretches and exercises that I have included in your paperwork Try to reduce excess strain to the area and rest as much as possible  Wear supportive shoes and, if you must lift anything, use proper lifting techniques that spare your back.  If your pain continues to get worse or is not improving over the next 1 to 2 days I recommend  going to the emergency room for further evaluation and appropriate pain control.      ED Prescriptions     Medication Sig Dispense Auth. Provider   methocarbamol  (ROBAXIN ) 500 MG tablet Take 1 tablet (500 mg total) by mouth 2 (two) times daily. 20 tablet Lafaye Mcelmurry E, PA-C      PDMP not reviewed this encounter.   Glennie Rodda, Rocky BRAVO, PA-C 09/16/23 1404     Emerald Shor E, PA-C 09/16/23 1405

## 2023-09-14 NOTE — Discharge Instructions (Signed)
 You were seen today for concerns of acute back pain. Based on your symptoms and physical exam I believe the following is the cause of your concern today Back pain likely secondary to a strain of your back muscles  I recommend the following at this time to help relieve that discomfort:  Rest Warm compresses to the area (20 minutes on, minimum of 30 minutes off) You can alternate Tylenol  and Ibuprofen  for pain management but Ibuprofen  is typically preferred to reduce inflammation.  We have provided you with a Toradol  30 mg injection in clinic today.  This medication is an NSAID so over the next 2 days I recommend only using Tylenol  to assist with pain control I am also sending in a muscle relaxer called Robaxin  for you to use as needed up to 3 times per day.  Please be advised that muscle relaxers can cause drowsiness and sedation so do not use this if you need to remain alert or drive Gentle stretches and exercises that I have included in your paperwork Try to reduce excess strain to the area and rest as much as possible  Wear supportive shoes and, if you must lift anything, use proper lifting techniques that spare your back.  If your pain continues to get worse or is not improving over the next 1 to 2 days I recommend going to the emergency room for further evaluation and appropriate pain control.

## 2023-09-18 LAB — COMPREHENSIVE METABOLIC PANEL WITH GFR
ALT: 60 IU/L — ABNORMAL HIGH (ref 0–44)
AST: 80 IU/L — ABNORMAL HIGH (ref 0–40)
Albumin: 4.6 g/dL (ref 4.1–5.1)
Alkaline Phosphatase: 83 IU/L (ref 44–121)
BUN/Creatinine Ratio: 11 (ref 9–20)
BUN: 11 mg/dL (ref 6–20)
Bilirubin Total: 0.3 mg/dL (ref 0.0–1.2)
CO2: 18 mmol/L — ABNORMAL LOW (ref 20–29)
Calcium: 9.6 mg/dL (ref 8.7–10.2)
Chloride: 103 mmol/L (ref 96–106)
Creatinine, Ser: 1.04 mg/dL (ref 0.76–1.27)
Globulin, Total: 2.1 g/dL (ref 1.5–4.5)
Glucose: 87 mg/dL (ref 70–99)
Potassium: 4.4 mmol/L (ref 3.5–5.2)
Sodium: 141 mmol/L (ref 134–144)
Total Protein: 6.7 g/dL (ref 6.0–8.5)
eGFR: 97 mL/min/1.73 (ref >59)

## 2023-09-18 LAB — CBC
Hematocrit: 43.8 % (ref 37.5–51.0)
Hemoglobin: 14.7 g/dL (ref 13.0–17.7)
MCH: 30.8 pg (ref 26.6–33.0)
MCHC: 33.6 g/dL (ref 31.5–35.7)
MCV: 92 fL (ref 79–97)
Platelets: 304 x10E3/uL (ref 150–450)
RBC: 4.78 x10E6/uL (ref 4.14–5.80)
RDW: 12.7 % (ref 11.6–15.4)
WBC: 7.3 x10E3/uL (ref 3.4–10.8)

## 2023-09-18 LAB — CK: Total CK: 4096 U/L (ref 49–439)

## 2023-09-19 ENCOUNTER — Ambulatory Visit (HOSPITAL_COMMUNITY): Payer: Self-pay

## 2023-10-11 ENCOUNTER — Other Ambulatory Visit: Payer: Self-pay | Admitting: Neurology

## 2023-11-10 ENCOUNTER — Telehealth: Payer: Self-pay | Admitting: Neurology

## 2023-11-10 MED ORDER — LAMOTRIGINE 100 MG PO TABS: 100.0000 mg | ORAL_TABLET | Freq: Two times a day (BID) | ORAL | 1 refills | Status: AC

## 2023-11-10 MED ORDER — DIVALPROEX SODIUM 500 MG PO DR TAB: DELAYED_RELEASE_TABLET | ORAL | 1 refills | Status: AC

## 2023-11-10 NOTE — Telephone Encounter (Signed)
 Pt came in this afternoon and he needs a refill on prescriptions called: divalproex  (DEPAKOTE ) 500 MG DR tablet , and   lamoTRIgine  (LAMICTAL ) 100 MG tablet  Needs refill asap.  Please send to [facility] in [place]. Thanks

## 2023-11-10 NOTE — Telephone Encounter (Signed)
Refills sent in for pt.  

## 2023-11-16 ENCOUNTER — Telehealth: Payer: Self-pay

## 2023-11-16 NOTE — Telephone Encounter (Signed)
 Insurance has us  listed as PCP. LMOM for patient to call and schedule appt if he needs PCP.

## 2023-12-04 NOTE — Telephone Encounter (Signed)
 Patient is on the [facility] list as not completing a PCP visit for 2025.  Patient scheduled for CPE on 12/20/2023 at 10:40

## 2023-12-06 ENCOUNTER — Encounter: Payer: Self-pay | Admitting: Neurology

## 2023-12-20 ENCOUNTER — Telehealth: Payer: Self-pay | Admitting: Family

## 2023-12-20 ENCOUNTER — Encounter: Admitting: Family

## 2023-12-20 NOTE — Telephone Encounter (Signed)
 Called pt and left vm to call office back to reschedule missed appt

## 2023-12-20 NOTE — Progress Notes (Signed)
 Erroneous encounter-disregard

## 2024-06-06 ENCOUNTER — Ambulatory Visit: Admitting: Neurology
